# Patient Record
Sex: Male | Born: 1935 | Race: White | Hispanic: No | Smoking: Former smoker
Health system: Southern US, Community
[De-identification: ages and names within clinical notes are randomized; demographics above are authoritative.]

## PROBLEM LIST (undated history)

## (undated) DIAGNOSIS — I712 Thoracic aortic aneurysm, without rupture, unspecified: Secondary | ICD-10-CM

## (undated) DIAGNOSIS — I10 Essential (primary) hypertension: Secondary | ICD-10-CM

## (undated) DIAGNOSIS — E119 Type 2 diabetes mellitus without complications: Secondary | ICD-10-CM

## (undated) DIAGNOSIS — S065X9A Traumatic subdural hemorrhage with loss of consciousness of unspecified duration, initial encounter: Secondary | ICD-10-CM

## (undated) DIAGNOSIS — E7800 Pure hypercholesterolemia, unspecified: Secondary | ICD-10-CM

## (undated) HISTORY — PX: HERNIA REPAIR: SHX51

## (undated) HISTORY — PX: INGUINAL HERNIA REPAIR: SHX194

---

## 1997-06-18 ENCOUNTER — Emergency Department (HOSPITAL_COMMUNITY): Admission: EM | Admit: 1997-06-18 | Discharge: 1997-06-18 | Payer: Self-pay | Admitting: Emergency Medicine

## 1997-11-16 ENCOUNTER — Ambulatory Visit (HOSPITAL_COMMUNITY): Admission: RE | Admit: 1997-11-16 | Discharge: 1997-11-16 | Payer: Self-pay | Admitting: Cardiovascular Disease

## 1998-06-26 ENCOUNTER — Ambulatory Visit (HOSPITAL_COMMUNITY): Admission: RE | Admit: 1998-06-26 | Discharge: 1998-06-26 | Payer: Self-pay | Admitting: Cardiology

## 1998-07-20 ENCOUNTER — Encounter: Payer: Self-pay | Admitting: Cardiovascular Disease

## 1998-07-20 ENCOUNTER — Inpatient Hospital Stay (HOSPITAL_COMMUNITY): Admission: EM | Admit: 1998-07-20 | Discharge: 1998-07-26 | Payer: Self-pay | Admitting: Emergency Medicine

## 1999-08-06 ENCOUNTER — Encounter: Payer: Self-pay | Admitting: Urology

## 1999-08-06 ENCOUNTER — Encounter: Admission: RE | Admit: 1999-08-06 | Discharge: 1999-08-06 | Payer: Self-pay | Admitting: Urology

## 1999-08-21 ENCOUNTER — Encounter: Payer: Self-pay | Admitting: Urology

## 1999-08-21 ENCOUNTER — Encounter: Admission: RE | Admit: 1999-08-21 | Discharge: 1999-08-21 | Payer: Self-pay | Admitting: Urology

## 1999-08-29 ENCOUNTER — Encounter: Admission: RE | Admit: 1999-08-29 | Discharge: 1999-08-29 | Payer: Self-pay | Admitting: Urology

## 1999-08-29 ENCOUNTER — Encounter: Payer: Self-pay | Admitting: Urology

## 2000-05-08 ENCOUNTER — Other Ambulatory Visit: Admission: RE | Admit: 2000-05-08 | Discharge: 2000-05-08 | Payer: Self-pay | Admitting: Urology

## 2000-05-08 ENCOUNTER — Encounter (INDEPENDENT_AMBULATORY_CARE_PROVIDER_SITE_OTHER): Payer: Self-pay | Admitting: Specialist

## 2000-08-20 ENCOUNTER — Encounter: Payer: Self-pay | Admitting: Internal Medicine

## 2000-08-20 ENCOUNTER — Ambulatory Visit (HOSPITAL_COMMUNITY): Admission: RE | Admit: 2000-08-20 | Discharge: 2000-08-20 | Payer: Self-pay | Admitting: Internal Medicine

## 2000-08-26 ENCOUNTER — Emergency Department (HOSPITAL_COMMUNITY): Admission: EM | Admit: 2000-08-26 | Discharge: 2000-08-27 | Payer: Self-pay | Admitting: *Deleted

## 2001-11-01 ENCOUNTER — Encounter: Payer: Self-pay | Admitting: *Deleted

## 2001-11-01 ENCOUNTER — Emergency Department (HOSPITAL_COMMUNITY): Admission: EM | Admit: 2001-11-01 | Discharge: 2001-11-01 | Payer: Self-pay | Admitting: *Deleted

## 2002-09-21 ENCOUNTER — Emergency Department (HOSPITAL_COMMUNITY): Admission: EM | Admit: 2002-09-21 | Discharge: 2002-09-21 | Payer: Self-pay | Admitting: Emergency Medicine

## 2003-10-14 ENCOUNTER — Emergency Department (HOSPITAL_COMMUNITY): Admission: EM | Admit: 2003-10-14 | Discharge: 2003-10-14 | Payer: Self-pay | Admitting: *Deleted

## 2005-08-27 ENCOUNTER — Ambulatory Visit (HOSPITAL_COMMUNITY): Admission: RE | Admit: 2005-08-27 | Discharge: 2005-08-27 | Payer: Self-pay | Admitting: Family Medicine

## 2005-11-03 ENCOUNTER — Ambulatory Visit: Payer: Self-pay | Admitting: Pulmonary Disease

## 2005-11-08 ENCOUNTER — Emergency Department (HOSPITAL_COMMUNITY): Admission: EM | Admit: 2005-11-08 | Discharge: 2005-11-08 | Payer: Self-pay | Admitting: Emergency Medicine

## 2005-12-01 ENCOUNTER — Ambulatory Visit: Payer: Self-pay | Admitting: Pulmonary Disease

## 2005-12-03 ENCOUNTER — Ambulatory Visit: Payer: Self-pay | Admitting: Cardiology

## 2006-02-03 ENCOUNTER — Ambulatory Visit: Payer: Self-pay | Admitting: Pulmonary Disease

## 2006-02-11 ENCOUNTER — Ambulatory Visit (HOSPITAL_COMMUNITY): Admission: RE | Admit: 2006-02-11 | Discharge: 2006-02-11 | Payer: Self-pay | Admitting: Pulmonary Disease

## 2006-06-17 ENCOUNTER — Emergency Department (HOSPITAL_COMMUNITY): Admission: EM | Admit: 2006-06-17 | Discharge: 2006-06-17 | Payer: Self-pay | Admitting: Emergency Medicine

## 2007-01-06 ENCOUNTER — Emergency Department (HOSPITAL_COMMUNITY): Admission: EM | Admit: 2007-01-06 | Discharge: 2007-01-06 | Payer: Self-pay | Admitting: Emergency Medicine

## 2007-01-28 ENCOUNTER — Ambulatory Visit: Payer: Self-pay | Admitting: Internal Medicine

## 2007-01-28 LAB — CONVERTED CEMR LAB
Basophils Absolute: 0 10*3/uL (ref 0.0–0.1)
Eosinophils Relative: 1.4 % (ref 0.0–5.0)
Hemoglobin: 15.5 g/dL (ref 13.0–17.0)
IgE (Immunoglobulin E), Serum: 51.7 intl units/mL (ref 0.0–180.0)
Monocytes Absolute: 0.6 10*3/uL (ref 0.2–0.7)
Neutro Abs: 6.4 10*3/uL (ref 1.4–7.7)
RBC: 4.71 M/uL (ref 4.22–5.81)
RDW: 11.7 % (ref 11.5–14.6)

## 2007-03-06 ENCOUNTER — Emergency Department (HOSPITAL_COMMUNITY): Admission: EM | Admit: 2007-03-06 | Discharge: 2007-03-06 | Payer: Self-pay | Admitting: Emergency Medicine

## 2007-03-26 DIAGNOSIS — J309 Allergic rhinitis, unspecified: Secondary | ICD-10-CM | POA: Insufficient documentation

## 2007-03-26 DIAGNOSIS — Z862 Personal history of diseases of the blood and blood-forming organs and certain disorders involving the immune mechanism: Secondary | ICD-10-CM

## 2007-03-26 DIAGNOSIS — Z8639 Personal history of other endocrine, nutritional and metabolic disease: Secondary | ICD-10-CM

## 2007-03-26 DIAGNOSIS — K219 Gastro-esophageal reflux disease without esophagitis: Secondary | ICD-10-CM

## 2007-03-29 ENCOUNTER — Ambulatory Visit: Payer: Self-pay | Admitting: Internal Medicine

## 2007-03-29 DIAGNOSIS — R05 Cough: Secondary | ICD-10-CM

## 2007-03-29 DIAGNOSIS — J209 Acute bronchitis, unspecified: Secondary | ICD-10-CM

## 2007-05-09 ENCOUNTER — Emergency Department (HOSPITAL_COMMUNITY): Admission: EM | Admit: 2007-05-09 | Discharge: 2007-05-09 | Payer: Self-pay | Admitting: Emergency Medicine

## 2007-08-05 ENCOUNTER — Ambulatory Visit: Payer: Self-pay | Admitting: Internal Medicine

## 2007-09-07 ENCOUNTER — Encounter (INDEPENDENT_AMBULATORY_CARE_PROVIDER_SITE_OTHER): Payer: Self-pay | Admitting: General Surgery

## 2007-09-07 ENCOUNTER — Ambulatory Visit (HOSPITAL_COMMUNITY): Admission: RE | Admit: 2007-09-07 | Discharge: 2007-09-07 | Payer: Self-pay | Admitting: General Surgery

## 2007-11-24 ENCOUNTER — Ambulatory Visit: Payer: Self-pay | Admitting: Vascular Surgery

## 2007-11-24 ENCOUNTER — Ambulatory Visit (HOSPITAL_COMMUNITY): Admission: RE | Admit: 2007-11-24 | Discharge: 2007-11-24 | Payer: Self-pay | Admitting: Neurology

## 2007-11-24 ENCOUNTER — Encounter (INDEPENDENT_AMBULATORY_CARE_PROVIDER_SITE_OTHER): Payer: Self-pay | Admitting: Neurology

## 2007-11-29 ENCOUNTER — Encounter: Admission: RE | Admit: 2007-11-29 | Discharge: 2007-11-29 | Payer: Self-pay | Admitting: Neurology

## 2008-01-28 ENCOUNTER — Emergency Department (HOSPITAL_COMMUNITY): Admission: EM | Admit: 2008-01-28 | Discharge: 2008-01-28 | Payer: Self-pay | Admitting: Emergency Medicine

## 2008-02-04 ENCOUNTER — Ambulatory Visit: Payer: Self-pay | Admitting: Internal Medicine

## 2008-02-19 DIAGNOSIS — J328 Other chronic sinusitis: Secondary | ICD-10-CM | POA: Insufficient documentation

## 2008-09-24 ENCOUNTER — Emergency Department (HOSPITAL_COMMUNITY): Admission: EM | Admit: 2008-09-24 | Discharge: 2008-09-24 | Payer: Self-pay | Admitting: Emergency Medicine

## 2009-02-02 ENCOUNTER — Ambulatory Visit: Payer: Self-pay | Admitting: Internal Medicine

## 2009-02-02 DIAGNOSIS — H6120 Impacted cerumen, unspecified ear: Secondary | ICD-10-CM

## 2010-03-08 ENCOUNTER — Emergency Department (HOSPITAL_COMMUNITY)
Admission: EM | Admit: 2010-03-08 | Discharge: 2010-03-08 | Payer: Self-pay | Source: Home / Self Care | Admitting: Emergency Medicine

## 2010-03-11 ENCOUNTER — Emergency Department (HOSPITAL_COMMUNITY)
Admission: EM | Admit: 2010-03-11 | Discharge: 2010-03-11 | Payer: Self-pay | Source: Home / Self Care | Admitting: Emergency Medicine

## 2010-04-09 ENCOUNTER — Ambulatory Visit
Admission: RE | Admit: 2010-04-09 | Discharge: 2010-04-09 | Payer: Self-pay | Source: Home / Self Care | Attending: Surgery | Admitting: Surgery
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
## 2010-05-16 DIAGNOSIS — S065X9A Traumatic subdural hemorrhage with loss of consciousness of unspecified duration, initial encounter: Secondary | ICD-10-CM

## 2010-05-16 DIAGNOSIS — S065XAA Traumatic subdural hemorrhage with loss of consciousness status unknown, initial encounter: Secondary | ICD-10-CM

## 2010-05-16 HISTORY — DX: Traumatic subdural hemorrhage with loss of consciousness of unspecified duration, initial encounter: S06.5X9A

## 2010-05-16 HISTORY — DX: Traumatic subdural hemorrhage with loss of consciousness status unknown, initial encounter: S06.5XAA

## 2010-05-26 ENCOUNTER — Inpatient Hospital Stay (HOSPITAL_COMMUNITY): Payer: Medicare Other

## 2010-05-26 ENCOUNTER — Emergency Department (HOSPITAL_COMMUNITY): Payer: Medicare Other

## 2010-05-26 ENCOUNTER — Emergency Department (HOSPITAL_COMMUNITY)
Admission: EM | Admit: 2010-05-26 | Discharge: 2010-05-26 | Disposition: A | Discharge status: Other Acute Inpatient Hospital | Payer: Medicare Other | Source: Home / Self Care | Attending: Emergency Medicine | Admitting: Emergency Medicine

## 2010-05-26 ENCOUNTER — Inpatient Hospital Stay (HOSPITAL_COMMUNITY)
Admission: AD | Admit: 2010-05-26 | Discharge: 2010-06-13 | DRG: 085 | Disposition: A | Discharge status: Skilled Nursing Facility | Payer: Medicare Other | Source: Other Acute Inpatient Hospital | Attending: Pulmonary Disease | Admitting: Pulmonary Disease

## 2010-05-26 DIAGNOSIS — R1110 Vomiting, unspecified: Secondary | ICD-10-CM | POA: Diagnosis present

## 2010-05-26 DIAGNOSIS — I959 Hypotension, unspecified: Secondary | ICD-10-CM | POA: Diagnosis present

## 2010-05-26 DIAGNOSIS — W19XXXA Unspecified fall, initial encounter: Secondary | ICD-10-CM | POA: Diagnosis present

## 2010-05-26 DIAGNOSIS — J15211 Pneumonia due to Methicillin susceptible Staphylococcus aureus: Secondary | ICD-10-CM | POA: Diagnosis not present

## 2010-05-26 DIAGNOSIS — R4182 Altered mental status, unspecified: Secondary | ICD-10-CM | POA: Insufficient documentation

## 2010-05-26 DIAGNOSIS — J9600 Acute respiratory failure, unspecified whether with hypoxia or hypercapnia: Secondary | ICD-10-CM | POA: Diagnosis present

## 2010-05-26 DIAGNOSIS — E871 Hypo-osmolality and hyponatremia: Secondary | ICD-10-CM | POA: Diagnosis not present

## 2010-05-26 DIAGNOSIS — S065XAA Traumatic subdural hemorrhage with loss of consciousness status unknown, initial encounter: Principal | ICD-10-CM | POA: Diagnosis present

## 2010-05-26 DIAGNOSIS — R6521 Severe sepsis with septic shock: Secondary | ICD-10-CM

## 2010-05-26 DIAGNOSIS — N400 Enlarged prostate without lower urinary tract symptoms: Secondary | ICD-10-CM | POA: Diagnosis present

## 2010-05-26 DIAGNOSIS — A419 Sepsis, unspecified organism: Secondary | ICD-10-CM

## 2010-05-26 DIAGNOSIS — G9340 Encephalopathy, unspecified: Secondary | ICD-10-CM | POA: Diagnosis not present

## 2010-05-26 DIAGNOSIS — R6520 Severe sepsis without septic shock: Secondary | ICD-10-CM

## 2010-05-26 DIAGNOSIS — Z794 Long term (current) use of insulin: Secondary | ICD-10-CM

## 2010-05-26 DIAGNOSIS — W1809XA Striking against other object with subsequent fall, initial encounter: Secondary | ICD-10-CM | POA: Insufficient documentation

## 2010-05-26 DIAGNOSIS — S060X9A Concussion with loss of consciousness of unspecified duration, initial encounter: Secondary | ICD-10-CM | POA: Insufficient documentation

## 2010-05-26 DIAGNOSIS — Y9229 Other specified public building as the place of occurrence of the external cause: Secondary | ICD-10-CM | POA: Insufficient documentation

## 2010-05-26 DIAGNOSIS — E876 Hypokalemia: Secondary | ICD-10-CM | POA: Diagnosis not present

## 2010-05-26 DIAGNOSIS — E1169 Type 2 diabetes mellitus with other specified complication: Secondary | ICD-10-CM | POA: Insufficient documentation

## 2010-05-26 DIAGNOSIS — R404 Transient alteration of awareness: Secondary | ICD-10-CM | POA: Diagnosis present

## 2010-05-26 DIAGNOSIS — E870 Hyperosmolality and hypernatremia: Secondary | ICD-10-CM | POA: Diagnosis not present

## 2010-05-26 DIAGNOSIS — S06309A Unspecified focal traumatic brain injury with loss of consciousness of unspecified duration, initial encounter: Principal | ICD-10-CM | POA: Diagnosis present

## 2010-05-26 DIAGNOSIS — Z6835 Body mass index (BMI) 35.0-35.9, adult: Secondary | ICD-10-CM

## 2010-05-26 DIAGNOSIS — Z515 Encounter for palliative care: Secondary | ICD-10-CM

## 2010-05-26 DIAGNOSIS — R579 Shock, unspecified: Secondary | ICD-10-CM | POA: Diagnosis not present

## 2010-05-26 DIAGNOSIS — I10 Essential (primary) hypertension: Secondary | ICD-10-CM | POA: Diagnosis present

## 2010-05-26 DIAGNOSIS — J309 Allergic rhinitis, unspecified: Secondary | ICD-10-CM | POA: Diagnosis present

## 2010-05-26 DIAGNOSIS — IMO0002 Reserved for concepts with insufficient information to code with codable children: Secondary | ICD-10-CM | POA: Diagnosis present

## 2010-05-26 DIAGNOSIS — J690 Pneumonitis due to inhalation of food and vomit: Secondary | ICD-10-CM | POA: Diagnosis not present

## 2010-05-26 DIAGNOSIS — Z7982 Long term (current) use of aspirin: Secondary | ICD-10-CM

## 2010-05-26 LAB — CBC
Hemoglobin: 16 g/dL (ref 13.0–17.0)
MCHC: 33.4 g/dL (ref 30.0–36.0)
MCV: 91.1 fL (ref 78.0–100.0)
Platelets: 308 10*3/uL (ref 150–400)
RDW: 13 % (ref 11.5–15.5)
WBC: 12.4 10*3/uL — ABNORMAL HIGH (ref 4.0–10.5)

## 2010-05-26 LAB — ETHANOL: Alcohol, Ethyl (B): <5 mg/dL (ref 0–10)

## 2010-05-26 LAB — DIFFERENTIAL
Basophils Absolute: 0 10*3/uL (ref 0.0–0.1)
Basophils Relative: 0 % (ref 0–1)
Eosinophils Absolute: 0.4 10*3/uL (ref 0.0–0.7)
Lymphocytes Relative: 49 % — ABNORMAL HIGH (ref 12–46)
Monocytes Absolute: 1 10*3/uL (ref 0.1–1.0)

## 2010-05-26 LAB — BLOOD GAS, ARTERIAL
Acid-base deficit: 3.4 mmol/L — ABNORMAL HIGH (ref 0.0–2.0)
Acid-base deficit: 2.4 mmol/L — ABNORMAL HIGH (ref 0.0–2.0)
Bicarbonate: 21.1 mEq/L (ref 20.0–24.0)
FIO2: 100 %
MECHVT: 550 mL
MECHVT: 450 mL
O2 Saturation: 98.6 %
RATE: 16 resp/min
TCO2: 23.4 mmol/L (ref 0–100)
pH, Arterial: 7.366 (ref 7.350–7.450)
pH, Arterial: 7.354 (ref 7.350–7.450)
pO2, Arterial: 126 mmHg — ABNORMAL HIGH (ref 80.0–100.0)

## 2010-05-26 LAB — URINALYSIS, ROUTINE W REFLEX MICROSCOPIC
Bilirubin Urine: NEGATIVE
Glucose, UA: 500 mg/dL — AB
Hgb urine dipstick: NEGATIVE
Specific Gravity, Urine: >1.03 — ABNORMAL HIGH (ref 1.005–1.030)
pH: 5 (ref 5.0–8.0)

## 2010-05-26 LAB — GLUCOSE, CAPILLARY
Glucose-Capillary: 137 mg/dL — ABNORMAL HIGH (ref 70–99)
Glucose-Capillary: 106 mg/dL — ABNORMAL HIGH (ref 70–99)

## 2010-05-26 LAB — BASIC METABOLIC PANEL
BUN: 12 mg/dL (ref 6–23)
CO2: 21 mEq/L (ref 19–32)
GFR calc Af Amer: >60 mL/min (ref >60)
GFR calc non Af Amer: >60 mL/min (ref >60)
Glucose, Bld: 127 mg/dL — ABNORMAL HIGH (ref 70–99)
Potassium: 3.8 mEq/L (ref 3.5–5.1)

## 2010-05-26 LAB — LACTIC ACID, PLASMA: Lactic Acid, Venous: 2.5 mmol/L — ABNORMAL HIGH (ref 0.5–2.2)

## 2010-05-26 LAB — RAPID URINE DRUG SCREEN, HOSP PERFORMED
Barbiturates: NOT DETECTED
Cocaine: NOT DETECTED
Tetrahydrocannabinol: NOT DETECTED

## 2010-05-26 LAB — MRSA PCR SCREENING: MRSA by PCR: NEGATIVE

## 2010-05-26 MED ORDER — IOHEXOL 350 MG/ML SOLN
120.0000 mL | Freq: Once | INTRAVENOUS | Status: AC | PRN
  Administered 2010-05-26: 120 mL via INTRAVENOUS

## 2010-05-27 ENCOUNTER — Inpatient Hospital Stay (HOSPITAL_COMMUNITY): Payer: Medicare Other

## 2010-05-27 DIAGNOSIS — R55 Syncope and collapse: Secondary | ICD-10-CM

## 2010-05-27 DIAGNOSIS — I609 Nontraumatic subarachnoid hemorrhage, unspecified: Secondary | ICD-10-CM

## 2010-05-27 DIAGNOSIS — E162 Hypoglycemia, unspecified: Secondary | ICD-10-CM

## 2010-05-27 DIAGNOSIS — J9600 Acute respiratory failure, unspecified whether with hypoxia or hypercapnia: Secondary | ICD-10-CM

## 2010-05-27 LAB — BASIC METABOLIC PANEL
Calcium: 7.7 mg/dL — ABNORMAL LOW (ref 8.4–10.5)
Calcium: 9 mg/dL (ref 8.4–10.5)
Creatinine, Ser: 0.92 mg/dL (ref 0.4–1.5)
GFR calc Af Amer: >60 mL/min (ref >60)
GFR calc Af Amer: >60 mL/min (ref >60)
GFR calc non Af Amer: >60 mL/min (ref >60)
GFR calc non Af Amer: >60 mL/min (ref >60)
Sodium: 135 mEq/L (ref 135–145)
Sodium: 136 mEq/L (ref 135–145)

## 2010-05-27 LAB — DIFFERENTIAL
Basophils Absolute: 0 10*3/uL (ref 0.0–0.1)
Eosinophils Absolute: 0.1 10*3/uL (ref 0.0–0.7)
Eosinophils Relative: 0 % (ref 0–5)
Lymphocytes Relative: 12 % (ref 12–46)
Lymphocytes Relative: 16 % (ref 12–46)
Lymphs Abs: 1.2 10*3/uL (ref 0.7–4.0)
Monocytes Relative: 8 % (ref 3–12)
Neutro Abs: 8.3 10*3/uL — ABNORMAL HIGH (ref 1.7–7.7)
Neutro Abs: 8.6 10*3/uL — ABNORMAL HIGH (ref 1.7–7.7)
Neutrophils Relative %: 71 % (ref 43–77)
Neutrophils Relative %: 79 % — ABNORMAL HIGH (ref 43–77)

## 2010-05-27 LAB — BLOOD GAS, ARTERIAL
Acid-base deficit: 0.7 mmol/L (ref 0.0–2.0)
Bicarbonate: 23.8 mEq/L (ref 20.0–24.0)
MECHVT: 450 mL
RATE: 15 resp/min
TCO2: 25.1 mmol/L (ref 0–100)
pCO2 arterial: 41.7 mmHg (ref 35.0–45.0)
pO2, Arterial: 59.3 mmHg — ABNORMAL LOW (ref 80.0–100.0)

## 2010-05-27 LAB — TYPE AND SCREEN

## 2010-05-27 LAB — POCT CARDIAC MARKERS: CKMB, poc: 5.2 ng/mL (ref 1.0–8.0)

## 2010-05-27 LAB — GLUCOSE, CAPILLARY
Glucose-Capillary: 103 mg/dL — ABNORMAL HIGH (ref 70–99)
Glucose-Capillary: 180 mg/dL — ABNORMAL HIGH (ref 70–99)
Glucose-Capillary: 185 mg/dL — ABNORMAL HIGH (ref 70–99)
Glucose-Capillary: 185 mg/dL — ABNORMAL HIGH (ref 70–99)
Glucose-Capillary: 207 mg/dL — ABNORMAL HIGH (ref 70–99)
Glucose-Capillary: 211 mg/dL — ABNORMAL HIGH (ref 70–99)

## 2010-05-27 LAB — CBC
HCT: 41.2 % (ref 39.0–52.0)
Hemoglobin: 14.8 g/dL (ref 13.0–17.0)
MCHC: 33.5 g/dL (ref 30.0–36.0)
Platelets: 286 10*3/uL (ref 150–400)
RBC: 4.63 MIL/uL (ref 4.22–5.81)
RBC: 4.78 MIL/uL (ref 4.22–5.81)
RDW: 13.3 % (ref 11.5–15.5)
RDW: 13.3 % (ref 11.5–15.5)
WBC: 10.5 10*3/uL (ref 4.0–10.5)
WBC: 12.1 10*3/uL — ABNORMAL HIGH (ref 4.0–10.5)

## 2010-05-27 LAB — COMPREHENSIVE METABOLIC PANEL
BUN: 11 mg/dL (ref 6–23)
CO2: 26 mEq/L (ref 19–32)
Chloride: 107 mEq/L (ref 96–112)
Creatinine, Ser: 0.97 mg/dL (ref 0.4–1.5)
GFR calc non Af Amer: >60 mL/min (ref >60)
Total Bilirubin: 0.7 mg/dL (ref 0.3–1.2)

## 2010-05-27 LAB — CARBOXYHEMOGLOBIN: Carboxyhemoglobin: 0.9 % (ref 0.5–1.5)

## 2010-05-27 LAB — CARDIAC PANEL(CRET KIN+CKTOT+MB+TROPI)
Relative Index: 0.8 (ref 0.0–2.5)
Troponin I: 0.04 ng/mL (ref 0.00–0.06)

## 2010-05-27 LAB — APTT: aPTT: 26 seconds (ref 24–37)

## 2010-05-27 LAB — PROTIME-INR
INR: 1.01 (ref 0.00–1.49)
INR: 0.89 (ref 0.00–1.49)
Prothrombin Time: 12.3 seconds (ref 11.6–15.2)

## 2010-05-27 LAB — HEMOGLOBIN A1C
Hgb A1c MFr Bld: 7.6 % — ABNORMAL HIGH (ref <5.7)
Mean Plasma Glucose: 171 mg/dL — ABNORMAL HIGH (ref <117)

## 2010-05-28 ENCOUNTER — Inpatient Hospital Stay (HOSPITAL_COMMUNITY): Payer: Medicare Other

## 2010-05-28 LAB — URINE CULTURE: Colony Count: NO GROWTH

## 2010-05-28 LAB — BASIC METABOLIC PANEL
BUN: 11 mg/dL (ref 6–23)
BUN: 11 mg/dL (ref 6–23)
Chloride: 108 mEq/L (ref 96–112)
Creatinine, Ser: 1.03 mg/dL (ref 0.4–1.5)
GFR calc non Af Amer: >60 mL/min (ref >60)
Glucose, Bld: 212 mg/dL — ABNORMAL HIGH (ref 70–99)
Potassium: 4.3 mEq/L (ref 3.5–5.1)

## 2010-05-28 LAB — CBC
HCT: 37.4 % — ABNORMAL LOW (ref 39.0–52.0)
MCH: 31.1 pg (ref 26.0–34.0)
MCV: 91.4 fL (ref 78.0–100.0)
RBC: 4.09 MIL/uL — ABNORMAL LOW (ref 4.22–5.81)
WBC: 12.9 10*3/uL — ABNORMAL HIGH (ref 4.0–10.5)

## 2010-05-28 LAB — GLUCOSE, CAPILLARY
Glucose-Capillary: 197 mg/dL — ABNORMAL HIGH (ref 70–99)
Glucose-Capillary: 187 mg/dL — ABNORMAL HIGH (ref 70–99)

## 2010-05-28 LAB — MAGNESIUM: Magnesium: 1.6 mg/dL (ref 1.5–2.5)

## 2010-05-29 ENCOUNTER — Inpatient Hospital Stay (HOSPITAL_COMMUNITY): Payer: Medicare Other

## 2010-05-29 DIAGNOSIS — E162 Hypoglycemia, unspecified: Secondary | ICD-10-CM

## 2010-05-29 DIAGNOSIS — J9600 Acute respiratory failure, unspecified whether with hypoxia or hypercapnia: Secondary | ICD-10-CM

## 2010-05-29 DIAGNOSIS — I609 Nontraumatic subarachnoid hemorrhage, unspecified: Secondary | ICD-10-CM

## 2010-05-29 DIAGNOSIS — R404 Transient alteration of awareness: Secondary | ICD-10-CM

## 2010-05-29 LAB — BLOOD GAS, ARTERIAL
Acid-base deficit: 6.5 mmol/L — ABNORMAL HIGH (ref 0.0–2.0)
Bicarbonate: 17 mEq/L — ABNORMAL LOW (ref 20.0–24.0)
Drawn by: 320991
FIO2: 0.6 %
MECHVT: 570 mL
O2 Saturation: 98.7 %
PEEP: 5 cmH2O
Patient temperature: 98.6
RATE: 16 resp/min
TCO2: 17.9 mmol/L (ref 0–100)
pCO2 arterial: 26.5 mmHg — ABNORMAL LOW (ref 35.0–45.0)
pH, Arterial: 7.425 (ref 7.350–7.450)
pO2, Arterial: 118 mmHg — ABNORMAL HIGH (ref 80.0–100.0)

## 2010-05-29 LAB — PHOSPHORUS: Phosphorus: 2.3 mg/dL (ref 2.3–4.6)

## 2010-05-29 LAB — GLUCOSE, CAPILLARY
Glucose-Capillary: 238 mg/dL — ABNORMAL HIGH (ref 70–99)
Glucose-Capillary: 172 mg/dL — ABNORMAL HIGH (ref 70–99)
Glucose-Capillary: 252 mg/dL — ABNORMAL HIGH (ref 70–99)
Glucose-Capillary: 161 mg/dL — ABNORMAL HIGH (ref 70–99)

## 2010-05-29 LAB — CULTURE, RESPIRATORY W GRAM STAIN

## 2010-05-29 LAB — CBC
HCT: 35.4 % — ABNORMAL LOW (ref 39.0–52.0)
Hemoglobin: 12 g/dL — ABNORMAL LOW (ref 13.0–17.0)
RDW: 12.7 % (ref 11.5–15.5)
WBC: 11.7 10*3/uL — ABNORMAL HIGH (ref 4.0–10.5)

## 2010-05-29 LAB — BASIC METABOLIC PANEL
CO2: 23 mEq/L (ref 19–32)
Calcium: 7.8 mg/dL — ABNORMAL LOW (ref 8.4–10.5)
Chloride: 106 mEq/L (ref 96–112)
GFR calc Af Amer: >60 mL/min (ref >60)
Sodium: 135 mEq/L (ref 135–145)

## 2010-05-29 LAB — MAGNESIUM: Magnesium: 2.2 mg/dL (ref 1.5–2.5)

## 2010-05-30 ENCOUNTER — Inpatient Hospital Stay (HOSPITAL_COMMUNITY): Payer: Medicare Other

## 2010-05-30 LAB — GLUCOSE, CAPILLARY
Glucose-Capillary: 164 mg/dL — ABNORMAL HIGH (ref 70–99)
Glucose-Capillary: 155 mg/dL — ABNORMAL HIGH (ref 70–99)
Glucose-Capillary: 183 mg/dL — ABNORMAL HIGH (ref 70–99)

## 2010-05-30 LAB — CULTURE, BAL-QUANTITATIVE W GRAM STAIN

## 2010-05-30 LAB — BASIC METABOLIC PANEL
CO2: 26 mEq/L (ref 19–32)
Calcium: 7.6 mg/dL — ABNORMAL LOW (ref 8.4–10.5)
GFR calc Af Amer: >60 mL/min (ref >60)
Glucose, Bld: 161 mg/dL — ABNORMAL HIGH (ref 70–99)
Potassium: 4.2 mEq/L (ref 3.5–5.1)
Sodium: 137 mEq/L (ref 135–145)

## 2010-05-30 LAB — BLOOD GAS, ARTERIAL
Bicarbonate: 20.9 mEq/L (ref 20.0–24.0)
O2 Saturation: 94.9 %
Patient temperature: 101.3
Pressure control: 10 cmH2O
RATE: 14 resp/min
TCO2: 21.9 mmol/L (ref 0–100)
pH, Arterial: 7.366 (ref 7.350–7.450)

## 2010-05-30 LAB — CBC
HCT: 37.2 % — ABNORMAL LOW (ref 39.0–52.0)
Hemoglobin: 12.9 g/dL — ABNORMAL LOW (ref 13.0–17.0)
MCHC: 34.7 g/dL (ref 30.0–36.0)
RBC: 4.21 MIL/uL — ABNORMAL LOW (ref 4.22–5.81)
WBC: 15.4 10*3/uL — ABNORMAL HIGH (ref 4.0–10.5)

## 2010-05-31 ENCOUNTER — Inpatient Hospital Stay (HOSPITAL_COMMUNITY): Payer: Medicare Other

## 2010-05-31 LAB — BASIC METABOLIC PANEL
CO2: 23 mEq/L (ref 19–32)
Chloride: 116 mEq/L — ABNORMAL HIGH (ref 96–112)
GFR calc Af Amer: >60 mL/min (ref >60)
Potassium: 3.6 mEq/L (ref 3.5–5.1)
Sodium: 143 mEq/L (ref 135–145)

## 2010-05-31 LAB — GLUCOSE, CAPILLARY
Glucose-Capillary: 242 mg/dL — ABNORMAL HIGH (ref 70–99)
Glucose-Capillary: 272 mg/dL — ABNORMAL HIGH (ref 70–99)
Glucose-Capillary: 203 mg/dL — ABNORMAL HIGH (ref 70–99)
Glucose-Capillary: 235 mg/dL — ABNORMAL HIGH (ref 70–99)

## 2010-05-31 LAB — DIFFERENTIAL
Basophils Absolute: 0 10*3/uL (ref 0.0–0.1)
Basophils Relative: 0 % (ref 0–1)
Monocytes Relative: 8 % (ref 3–12)
Neutro Abs: 10.8 10*3/uL — ABNORMAL HIGH (ref 1.7–7.7)
Neutrophils Relative %: 85 % — ABNORMAL HIGH (ref 43–77)

## 2010-05-31 LAB — CBC
Hemoglobin: 12.2 g/dL — ABNORMAL LOW (ref 13.0–17.0)
RBC: 4.03 MIL/uL — ABNORMAL LOW (ref 4.22–5.81)

## 2010-06-01 ENCOUNTER — Inpatient Hospital Stay (HOSPITAL_COMMUNITY): Payer: Medicare Other

## 2010-06-01 LAB — BASIC METABOLIC PANEL
Calcium: 7.5 mg/dL — ABNORMAL LOW (ref 8.4–10.5)
Creatinine, Ser: 0.99 mg/dL (ref 0.4–1.5)
GFR calc non Af Amer: >60 mL/min (ref >60)
Glucose, Bld: 186 mg/dL — ABNORMAL HIGH (ref 70–99)
Sodium: 140 mEq/L (ref 135–145)

## 2010-06-01 LAB — CBC
MCV: 89.8 fL (ref 78.0–100.0)
Platelets: 239 10*3/uL (ref 150–400)
RDW: 13.6 % (ref 11.5–15.5)
WBC: 15.7 10*3/uL — ABNORMAL HIGH (ref 4.0–10.5)

## 2010-06-01 LAB — GLUCOSE, CAPILLARY: Glucose-Capillary: 158 mg/dL — ABNORMAL HIGH (ref 70–99)

## 2010-06-01 LAB — CULTURE, RESPIRATORY W GRAM STAIN

## 2010-06-01 LAB — CULTURE, BLOOD (SINGLE)
Culture  Setup Time: 201203112356
Culture: NO GROWTH

## 2010-06-02 ENCOUNTER — Inpatient Hospital Stay (HOSPITAL_COMMUNITY): Payer: Medicare Other

## 2010-06-02 LAB — CULTURE, BLOOD (SINGLE): Culture: NO GROWTH

## 2010-06-02 LAB — CBC
HCT: 34.8 % — ABNORMAL LOW (ref 39.0–52.0)
Hemoglobin: 11.8 g/dL — ABNORMAL LOW (ref 13.0–17.0)
MCH: 30.4 pg (ref 26.0–34.0)
MCV: 89.7 fL (ref 78.0–100.0)
RBC: 3.88 MIL/uL — ABNORMAL LOW (ref 4.22–5.81)
WBC: 12.4 10*3/uL — ABNORMAL HIGH (ref 4.0–10.5)

## 2010-06-02 LAB — BLOOD GAS, ARTERIAL
Drawn by: 312881
PEEP: 5 cmH2O
Patient temperature: 99
Pressure control: 10 cmH2O
RATE: 14 resp/min
TCO2: 27.1 mmol/L (ref 0–100)
pCO2 arterial: 41.1 mmHg (ref 35.0–45.0)
pH, Arterial: 7.417 (ref 7.350–7.450)

## 2010-06-02 LAB — BASIC METABOLIC PANEL
BUN: 30 mg/dL — ABNORMAL HIGH (ref 6–23)
Calcium: 7.8 mg/dL — ABNORMAL LOW (ref 8.4–10.5)
Chloride: 109 mEq/L (ref 96–112)
Creatinine, Ser: 0.77 mg/dL (ref 0.4–1.5)

## 2010-06-02 LAB — MAGNESIUM: Magnesium: 2.8 mg/dL — ABNORMAL HIGH (ref 1.5–2.5)

## 2010-06-02 LAB — GLUCOSE, CAPILLARY
Glucose-Capillary: 209 mg/dL — ABNORMAL HIGH (ref 70–99)
Glucose-Capillary: 208 mg/dL — ABNORMAL HIGH (ref 70–99)

## 2010-06-02 NOTE — Consult Note (Signed)
  NAMEGARETH, Garrett [name] NO.:  [id number]  MEDICAL RECORD NO.:  [id number]           PATIENT TYPE:  I  LOCATION:  3113                         FACILITY:  [facility]  PHYSICIAN:  Donalee Citrin, M.D.        DATE OF BIRTH:  1933-05-28  DATE OF CONSULTATION:  05/26/2010 DATE OF DISCHARGE:                                CONSULTATION   ADMITTING DIAGNOSES:  Syncopal episode, fall, closed-head injury, traumatic subarachnoid hemorrhage.  HISTORY OF PRESENT ILLNESS:  The patient is a 77 year old gentleman who apparently had a hypoglycemic episode where he had a syncopal episode and there was some question raised that he may have had a seizure but when EMS arrived they were not able to register a CBG.  The patient apparently was very combative when we started to come round.  They had to intubate him to control him and was taken to [facility], was evaluated.  CT scan obtained, it showed some traumatic subarachnoid, some questionable bifrontal contusions.  The patient has been transferred down for critical care management at [facility].  PAST MEDICAL HISTORY:  Remarkable for diabetes and hypertension.  SURGICAL HISTORY:  Unavailable and there is no family around to obtain the rest of his social and medication history.  ALLERGIES:  He has no known medication allergies.  MEDICATIONS:  We do not know what medications he is on.  At [facility], his blood work was remarkable for an ABG was 7.36, pCO2 of 377, and pO2 of 288.  Exam, the patient is intubated right now.  His pupils are equal and reactive.  He has roving eye movements.  He has bilateral purposeful movements.  He does not follow commands bilaterally.  His CT scan of his head shows some small bifrontal contusions with a traumatic subarachnoid and small parafalcine subdural.  CT scan of his C-spine is unremarkable except for degenerative changes.  Remainder of his blood work at [facility] is remarkable for sodium of 144,  potassium 3.8, chloride 107, carbon dioxide 21, glucose 127, BUN and creatinine 12 and 1.8 respectively.  White count is 12.8.  Hemoglobin/hematocrit were 16 and 47.  Platelet count was 308.  ASSESSMENT AND PLAN:  This is a 77 year old gentleman with multiple medical problems who has syncopal episode, striking his head, does not appear to have been on any kind of blood thinners or aspirin, has some small bifrontal contusions, little bit of traumatic subarachnoid.  The patient has been transferred and intubated, so the patient will be admitted to the Critical Care Service, they will extubate him as they feel as indicated.  I will repeat a CT scan in the morning.  I do not think these contusions at this point need any additional intervention; however, we will follow as they may evolve and they may got that.          ______________________________ Donalee Citrin, M.D.     GC/MEDQ  D:  05/26/2010  T:  05/27/2010  Job:  [id number]  Electronically Signed by Donalee Citrin M.D. on 06/02/2010 04:00:19 PM

## 2010-06-03 ENCOUNTER — Telehealth: Payer: Self-pay | Admitting: Internal Medicine

## 2010-06-03 ENCOUNTER — Inpatient Hospital Stay (HOSPITAL_COMMUNITY): Payer: Medicare Other

## 2010-06-03 DIAGNOSIS — J15211 Pneumonia due to Methicillin susceptible Staphylococcus aureus: Secondary | ICD-10-CM

## 2010-06-03 DIAGNOSIS — F05 Delirium due to known physiological condition: Secondary | ICD-10-CM

## 2010-06-03 LAB — GLUCOSE, CAPILLARY
Glucose-Capillary: 153 mg/dL — ABNORMAL HIGH (ref 70–99)
Glucose-Capillary: 155 mg/dL — ABNORMAL HIGH (ref 70–99)
Glucose-Capillary: 168 mg/dL — ABNORMAL HIGH (ref 70–99)
Glucose-Capillary: 170 mg/dL — ABNORMAL HIGH (ref 70–99)
Glucose-Capillary: 193 mg/dL — ABNORMAL HIGH (ref 70–99)

## 2010-06-03 LAB — CBC
MCH: 30.3 pg (ref 26.0–34.0)
MCHC: 33.8 g/dL (ref 30.0–36.0)
Platelets: 337 10*3/uL (ref 150–400)
RBC: 4.02 MIL/uL — ABNORMAL LOW (ref 4.22–5.81)

## 2010-06-03 LAB — MAGNESIUM: Magnesium: 2.5 mg/dL (ref 1.5–2.5)

## 2010-06-03 LAB — BLOOD GAS, ARTERIAL
Bicarbonate: 30.2 mEq/L — ABNORMAL HIGH (ref 20.0–24.0)
TCO2: 31.6 mmol/L (ref 0–100)
pCO2 arterial: 42.6 mmHg (ref 35.0–45.0)
pH, Arterial: 7.462 — ABNORMAL HIGH (ref 7.350–7.450)

## 2010-06-03 LAB — PHOSPHORUS: Phosphorus: 2.5 mg/dL (ref 2.3–4.6)

## 2010-06-03 LAB — BASIC METABOLIC PANEL
Calcium: 7.5 mg/dL — ABNORMAL LOW (ref 8.4–10.5)
Chloride: 102 mEq/L (ref 96–112)
Creatinine, Ser: 0.83 mg/dL (ref 0.4–1.5)
GFR calc Af Amer: >60 mL/min (ref >60)
Sodium: 139 mEq/L (ref 135–145)

## 2010-06-04 ENCOUNTER — Inpatient Hospital Stay (HOSPITAL_COMMUNITY): Payer: Medicare Other

## 2010-06-04 LAB — GLUCOSE, CAPILLARY
Glucose-Capillary: 150 mg/dL — ABNORMAL HIGH (ref 70–99)
Glucose-Capillary: 153 mg/dL — ABNORMAL HIGH (ref 70–99)
Glucose-Capillary: 163 mg/dL — ABNORMAL HIGH (ref 70–99)

## 2010-06-04 LAB — PROCALCITONIN: Procalcitonin: 0.29 ng/mL

## 2010-06-05 ENCOUNTER — Inpatient Hospital Stay (HOSPITAL_COMMUNITY): Payer: Medicare Other

## 2010-06-05 LAB — BLOOD GAS, ARTERIAL
Bicarbonate: 24.9 mEq/L — ABNORMAL HIGH (ref 20.0–24.0)
Drawn by: 129711
O2 Content: 3 L/min
O2 Saturation: 94 %
pCO2 arterial: 36.3 mmHg (ref 35.0–45.0)
pH, Arterial: 7.452 — ABNORMAL HIGH (ref 7.350–7.450)
pO2, Arterial: 68 mmHg — ABNORMAL LOW (ref 80.0–100.0)

## 2010-06-05 LAB — DIFFERENTIAL
Basophils Absolute: 0 10*3/uL (ref 0.0–0.1)
Eosinophils Relative: 0 % (ref 0–5)
Lymphocytes Relative: 6 % — ABNORMAL LOW (ref 12–46)
Lymphs Abs: 0.7 10*3/uL (ref 0.7–4.0)
Neutro Abs: 9.7 10*3/uL — ABNORMAL HIGH (ref 1.7–7.7)

## 2010-06-05 LAB — BASIC METABOLIC PANEL
BUN: 33 mg/dL — ABNORMAL HIGH (ref 6–23)
CO2: 26 mEq/L (ref 19–32)
Chloride: 110 mEq/L (ref 96–112)
GFR calc non Af Amer: >60 mL/min (ref >60)
Glucose, Bld: 186 mg/dL — ABNORMAL HIGH (ref 70–99)
Potassium: 3.3 mEq/L — ABNORMAL LOW (ref 3.5–5.1)
Sodium: 143 mEq/L (ref 135–145)

## 2010-06-05 LAB — CBC
HCT: 33.4 % — ABNORMAL LOW (ref 39.0–52.0)
Hemoglobin: 11.2 g/dL — ABNORMAL LOW (ref 13.0–17.0)
MCV: 90.5 fL (ref 78.0–100.0)
RDW: 13.6 % (ref 11.5–15.5)
WBC: 11 10*3/uL — ABNORMAL HIGH (ref 4.0–10.5)

## 2010-06-05 LAB — GLUCOSE, CAPILLARY
Glucose-Capillary: 134 mg/dL — ABNORMAL HIGH (ref 70–99)
Glucose-Capillary: 158 mg/dL — ABNORMAL HIGH (ref 70–99)
Glucose-Capillary: 172 mg/dL — ABNORMAL HIGH (ref 70–99)
Glucose-Capillary: 216 mg/dL — ABNORMAL HIGH (ref 70–99)

## 2010-06-06 ENCOUNTER — Inpatient Hospital Stay (HOSPITAL_COMMUNITY): Payer: Medicare Other

## 2010-06-06 LAB — GLUCOSE, CAPILLARY
Glucose-Capillary: 167 mg/dL — ABNORMAL HIGH (ref 70–99)
Glucose-Capillary: 181 mg/dL — ABNORMAL HIGH (ref 70–99)
Glucose-Capillary: 299 mg/dL — ABNORMAL HIGH (ref 70–99)
Glucose-Capillary: 216 mg/dL — ABNORMAL HIGH (ref 70–99)

## 2010-06-07 ENCOUNTER — Inpatient Hospital Stay (HOSPITAL_COMMUNITY): Payer: Medicare Other

## 2010-06-07 LAB — BASIC METABOLIC PANEL
Calcium: 8.4 mg/dL (ref 8.4–10.5)
GFR calc Af Amer: >60 mL/min (ref >60)
GFR calc non Af Amer: >60 mL/min (ref >60)
Glucose, Bld: 200 mg/dL — ABNORMAL HIGH (ref 70–99)
Potassium: 3.5 mEq/L (ref 3.5–5.1)
Sodium: 146 mEq/L — ABNORMAL HIGH (ref 135–145)

## 2010-06-07 LAB — PHOSPHORUS: Phosphorus: 2.6 mg/dL (ref 2.3–4.6)

## 2010-06-07 LAB — GLUCOSE, CAPILLARY
Glucose-Capillary: 177 mg/dL — ABNORMAL HIGH (ref 70–99)
Glucose-Capillary: 228 mg/dL — ABNORMAL HIGH (ref 70–99)

## 2010-06-07 LAB — PROCALCITONIN: Procalcitonin: 0.12 ng/mL

## 2010-06-08 LAB — GLUCOSE, CAPILLARY
Glucose-Capillary: 265 mg/dL — ABNORMAL HIGH (ref 70–99)
Glucose-Capillary: 169 mg/dL — ABNORMAL HIGH (ref 70–99)
Glucose-Capillary: 195 mg/dL — ABNORMAL HIGH (ref 70–99)
Glucose-Capillary: 200 mg/dL — ABNORMAL HIGH (ref 70–99)

## 2010-06-09 DIAGNOSIS — I609 Nontraumatic subarachnoid hemorrhage, unspecified: Secondary | ICD-10-CM

## 2010-06-09 DIAGNOSIS — G9340 Encephalopathy, unspecified: Secondary | ICD-10-CM

## 2010-06-09 DIAGNOSIS — J189 Pneumonia, unspecified organism: Secondary | ICD-10-CM

## 2010-06-09 DIAGNOSIS — F05 Delirium due to known physiological condition: Secondary | ICD-10-CM

## 2010-06-09 LAB — GLUCOSE, CAPILLARY
Glucose-Capillary: 221 mg/dL — ABNORMAL HIGH (ref 70–99)
Glucose-Capillary: 209 mg/dL — ABNORMAL HIGH (ref 70–99)
Glucose-Capillary: 210 mg/dL — ABNORMAL HIGH (ref 70–99)
Glucose-Capillary: 178 mg/dL — ABNORMAL HIGH (ref 70–99)
Glucose-Capillary: 211 mg/dL — ABNORMAL HIGH (ref 70–99)

## 2010-06-09 LAB — BASIC METABOLIC PANEL
BUN: 21 mg/dL (ref 6–23)
CO2: 25 mEq/L (ref 19–32)
Calcium: 8.7 mg/dL (ref 8.4–10.5)
Creatinine, Ser: 0.93 mg/dL (ref 0.4–1.5)
GFR calc Af Amer: >60 mL/min (ref >60)

## 2010-06-09 LAB — CBC
HCT: 39.2 % (ref 39.0–52.0)
Hemoglobin: 12.8 g/dL — ABNORMAL LOW (ref 13.0–17.0)
MCHC: 32.7 g/dL (ref 30.0–36.0)
RBC: 4.25 MIL/uL (ref 4.22–5.81)
WBC: 11.5 10*3/uL — ABNORMAL HIGH (ref 4.0–10.5)

## 2010-06-10 ENCOUNTER — Inpatient Hospital Stay (HOSPITAL_COMMUNITY): Payer: Medicare Other

## 2010-06-10 DIAGNOSIS — R404 Transient alteration of awareness: Secondary | ICD-10-CM

## 2010-06-10 DIAGNOSIS — E119 Type 2 diabetes mellitus without complications: Secondary | ICD-10-CM

## 2010-06-10 LAB — BLOOD GAS, ARTERIAL
Acid-Base Excess: 0.6 mmol/L (ref 0.0–2.0)
Bicarbonate: 23.9 mEq/L (ref 20.0–24.0)
Drawn by: 252031
O2 Content: 2 L/min
O2 Saturation: 94.6 %
Patient temperature: 98.6
TCO2: 24.9 mmol/L (ref 0–100)
pCO2 arterial: 32.8 mmHg — ABNORMAL LOW (ref 35.0–45.0)
pO2, Arterial: 70.1 mmHg — ABNORMAL LOW (ref 80.0–100.0)

## 2010-06-10 LAB — CBC
HCT: 37.3 % — ABNORMAL LOW (ref 39.0–52.0)
Hemoglobin: 12.2 g/dL — ABNORMAL LOW (ref 13.0–17.0)
MCH: 30 pg (ref 26.0–34.0)
MCHC: 32.7 g/dL (ref 30.0–36.0)
MCV: 91.9 fL (ref 78.0–100.0)
RBC: 4.06 MIL/uL — ABNORMAL LOW (ref 4.22–5.81)

## 2010-06-10 LAB — BASIC METABOLIC PANEL
BUN: 19 mg/dL (ref 6–23)
CO2: 26 mEq/L (ref 19–32)
Calcium: 8.2 mg/dL — ABNORMAL LOW (ref 8.4–10.5)
Chloride: 109 mEq/L (ref 96–112)
Creatinine, Ser: 0.95 mg/dL (ref 0.4–1.5)
GFR calc Af Amer: >60 mL/min (ref >60)
Glucose, Bld: 192 mg/dL — ABNORMAL HIGH (ref 70–99)

## 2010-06-10 LAB — GLUCOSE, CAPILLARY
Glucose-Capillary: 175 mg/dL — ABNORMAL HIGH (ref 70–99)
Glucose-Capillary: 214 mg/dL — ABNORMAL HIGH (ref 70–99)

## 2010-06-11 LAB — GLUCOSE, CAPILLARY
Glucose-Capillary: 296 mg/dL — ABNORMAL HIGH (ref 70–99)
Glucose-Capillary: 209 mg/dL — ABNORMAL HIGH (ref 70–99)

## 2010-06-12 ENCOUNTER — Inpatient Hospital Stay (HOSPITAL_COMMUNITY): Payer: Medicare Other

## 2010-06-12 DIAGNOSIS — R404 Transient alteration of awareness: Secondary | ICD-10-CM

## 2010-06-12 DIAGNOSIS — J9600 Acute respiratory failure, unspecified whether with hypoxia or hypercapnia: Secondary | ICD-10-CM

## 2010-06-12 DIAGNOSIS — I609 Nontraumatic subarachnoid hemorrhage, unspecified: Secondary | ICD-10-CM

## 2010-06-12 DIAGNOSIS — E119 Type 2 diabetes mellitus without complications: Secondary | ICD-10-CM

## 2010-06-12 LAB — GLUCOSE, CAPILLARY

## 2010-06-13 LAB — GLUCOSE, CAPILLARY
Glucose-Capillary: 191 mg/dL — ABNORMAL HIGH (ref 70–99)
Glucose-Capillary: 170 mg/dL — ABNORMAL HIGH (ref 70–99)
Glucose-Capillary: 225 mg/dL — ABNORMAL HIGH (ref 70–99)
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
## 2010-06-17 ENCOUNTER — Other Ambulatory Visit: Payer: Self-pay | Admitting: Neurosurgery

## 2010-06-17 DIAGNOSIS — I609 Nontraumatic subarachnoid hemorrhage, unspecified: Secondary | ICD-10-CM

## 2010-06-17 LAB — GLUCOSE, CAPILLARY: Glucose-Capillary: 197 mg/dL — ABNORMAL HIGH (ref 70–99)

## 2010-07-02 ENCOUNTER — Other Ambulatory Visit: Payer: Medicare Other

## 2010-07-11 ENCOUNTER — Ambulatory Visit
Admission: RE | Admit: 2010-07-11 | Discharge: 2010-07-11 | Disposition: A | Discharge status: Home / Self Care | Payer: Medicare Other | Source: Ambulatory Visit | Attending: Neurosurgery | Admitting: Neurosurgery

## 2010-07-11 ENCOUNTER — Other Ambulatory Visit: Payer: Self-pay | Admitting: Neurosurgery

## 2010-07-11 DIAGNOSIS — R51 Headache: Secondary | ICD-10-CM

## 2010-07-11 DIAGNOSIS — I609 Nontraumatic subarachnoid hemorrhage, unspecified: Secondary | ICD-10-CM

## 2010-07-23 ENCOUNTER — Ambulatory Visit: Payer: Self-pay | Admitting: Internal Medicine

## 2010-07-30 NOTE — H&P (Signed)
Garrett [name], Garrett [name]                ACCOUNT NO.:  [id number]   MEDICAL RECORD NO.:  [id number]           PATIENT TYPE:  AMB   LOCATION:  DAY                           FACILITY:  [facility]   PHYSICIAN:  Dalia Heading, M.D.  DATE OF BIRTH:  Feb 08, 1934   DATE OF ADMISSION:  DATE OF DISCHARGE:  LH                              HISTORY & PHYSICAL   CHIEF COMPLAINT:  Need for screening colonoscopy.   HISTORY OF PRESENT ILLNESS:  The patient is a 77 year old white male who  has referred for endoscopic evaluation.  He needs a colonoscopy for  screening purposes.  No abdominal pain, weight loss, nausea, vomiting,  diarrhea, constipation, melena, and hematochezia have been noted.  He  last had a colonoscopy over 10 years ago.  There is no family history of  colon carcinoma.   PAST MEDICAL HISTORY:  Include insulin-dependent diabetes mellitus.   PAST SURGICAL HISTORY:  Herniorrhaphy.   CURRENT MEDICATIONS:  Metformin, Januvia, and insulin.   ALLERGIES:  No known drug allergies.   REVIEW OF SYSTEMS:  Noncontributory.   PHYSICAL EXAMINATION:  The patient is a well-developed, well-nourished  white male in no acute distress.  LUNGS:  Clear to auscultation with equal breath sounds bilaterally.  HEART:  Reveals regular rate and rhythm without S3, S4, or murmur.  ABDOMEN:  Soft, nontender, and nondistended.  No hepatosplenomegaly or  masses are noted.  RECTAL:  Deferred to the procedure.   IMPRESSION:  Need for screening colonoscopy.   PLAN:  The patient scheduled for colonoscopy on September 07, 2007.  The  risks and benefits of the procedure including bleeding and perforation  were fully explained to the patient, and gain informed consent.      Dalia Heading, M.D.  Electronically Signed     MAJ/MEDQ  D:  08/31/2007  T:  09/01/2007  Job:  [id number]   cc:   Catalina Pizza, M.D.  Fax: [phone]

## 2010-07-30 NOTE — Assessment & Plan Note (Signed)
[facility]                             PULMONARY OFFICE NOTE   Garrett [name], Garrett [name]                       MRN:          [id number]  DATE:01/28/2007                            DOB:          [birth year]    PROBLEM:  A 77 year old man comes seeking my opinion about chronic cough  with possible allergy component.   HISTORY:  He had worked with Dr. Shelle [name] up until November 2007, with  diagnosis of cyclical cough, question also laryngopharyngeal reflux.  Spirometry had been normal.  CT of the sinuses had shown mild changes of  chronic sinusitis without acute sinusitis.  Chest x-ray had shown  diffuse peribronchial thickening compatible with chronic bronchitis and  also some healed left rib fractures.  A methacholine inhalation  challenge test on February 11, 2006, had been normal, negative for  hyperreactive airways and the associated spirometry was also normal.  ENT evaluation with Dr. Pollyann [name] had not identified a head or neck basis  for cough with no evidence of sinus disease or reflux.  Garrett [name] says  triggers include smoke and strong odors.  Intensity of cough varies from  day-to-day.  This has been going on several years.  He is aware of some  post nasal drainage and a lot of nasal stuffiness and discharge.  He  thinks he is worse in the Spring and Fall.  No better at the beach.   MEDICATIONS:  1. Januvia 100 mg.  2. Lantus 15 units.  3. Aspirin 81 mg.  4. Singulair 10 mg.   No medication allergy.   REVIEW OF SYSTEMS:  Nonproductive cough, nasal congestion.  He denies  any awareness of reflux, chest pain, palpitations, fever, discolored  sputum, adenopathy, rash or edema.   PAST HISTORY:  1. Heart catheterization, okay.  2. Diabetes.  3. Allergic rhinitis.  4. No history of pneumonia or asthma.  5. Normal upper endoscopy in 1999, although he carried a history of      GERD at that time.   No intolerance to latex, contrast dye, or  aspirin.   SOCIAL HISTORY:  Recently married.  Quit smoking cigars in 1998.  He is  retired from the [name] where he [employer].  There were warning signs in the work place against exposure to  carcinogens but nothing about respiratory risks.   FAMILY HISTORY:  Sister coughs a lot.  Family history of heart disease.   OBJECTIVE:  VITAL SIGNS:  Weight  185 pounds, pulse regular 69, room air  saturation 97%.  GENERAL:  Not in obvious distress.  Demonstrates dry cough.  NOSE/THROAT/EARS:  All clear.  CHEST:  Sounds clear.  No stridor.  Voice quality normal.  HEART:  Sounds normal without murmur.   Skin test:  Positive histamine, negative diluent controls.  Positive  reactions for grass, fall weed, selected tree pollens, and especially  for dust and dust mite.   IMPRESSION:  Cough which has been resistant to previous workups.  There  are clues suggesting this may have an allergic basis, possibly as an  allergic cough equivalent  asthma or mild allergic rhinitis.   PLAN:  1. Tessalon Perles.  2. Phenergan with codeine cough syrup 5 ml q.6 h. p.r.n.  3. RAST IgE panel.  4. CBC with diff.  5. Schedule return 1 month.  6. Environmental precautions discussed.     Garrett D. Maple Hudson, MD, [facility] [name], FACP  Electronically Signed    CDY/MedQ  DD: 01/30/2007  DT: 01/31/2007  Job #: [id number]   cc:   Melvyn Novas, MD

## 2010-08-02 NOTE — Assessment & Plan Note (Signed)
[facility]                               PULMONARY OFFICE NOTE   Garrett [name], Garrett [name]                       MRN:          [id number]  DATE:11/03/2005                            DOB:          10-03-1933    HISTORY OF PRESENT ILLNESS:  The patient is a very pleasant 77 year old  white male who comes in today for evaluation of cough.  The patient states  that he has had cough for several years but it has gotten to the point where  it is greatly affecting his quality of life.  The patient states that it  does not awaken him at night and it is quite variable during the day.  It is  no worse with lying down.  He states that the stimulus for his cough comes  from a tickle in his throat and he does note a lot of throat clearing as  well.  The cough is dry in nature and clearly worse with periods of  conversation and laughing.  He denies postnasal drip but does have  occasional GERD.  He has a history of GERD which dates back to the past for  which it was improved with proton pump inhibitor.  To the patient's  knowledge, he has never been diagnosed with any pulmonary disease. He has  had a chest x-ray a few months ago at [facility] that did show peribronchial  thickening.  Otherwise, it was noncontributory.   PAST MEDICAL HISTORY:  1. Diabetes.  2. A history of allergic rhinitis.  3. A history of bilateral inguinal hernia repair in 1999.   MEDICATIONS:  1. Januvia 100 mg daily.  2. Lantus 15 units daily.  3. Aspirin 81 mg daily.   ALLERGIES:  The patient has no known drug allergies.   SOCIAL HISTORY:  He has a history of smoking cigars for 20 years.  He has  not smoked since 1999.  He has never smoked cigarettes.  The patient is  widowed and does have children.  He is retired from [employer] where he worked  in the [employer].   FAMILY HISTORY:  Family history is remarkable for his mother having heart  disease.   REVIEW OF  SYSTEMS:  Review of systems as per history of present illness,  also see patient intake form back in the chart.   PHYSICAL EXAMINATION:  GENERAL:  In general, he is a well-developed white  male in no acute distress.  VITAL SIGNS:  Blood pressure 106/68.  Pulse 68.  Temperature 97.6. Weight is  192 pounds.  O2 saturation on room air is 94%.  HEENT:  Pupils are equal, round, and reactive to light and accommodation.  Extraocular muscles are intact.  Nares are patent without discharge.  Oropharynx is clear.  NECK:  Supple without JVD or lymphadenopathy. There is no palpable  thyromegaly.  CHEST:  Chest is totally clear to auscultation.  CARDIAC:  Exam reveals regular rate and rhythm; no murmurs, rubs, or  gallops.  ABDOMEN:  Soft, nontender, with good bowel sounds.  GENITAL/RECTAL/BREASTS:  Exams not  done and not indicated.  EXTREMITIES:  Lower extremities are without edema.  Pulses are intact  distally.  NEUROLOGIC:  Neurologically, alert and oriented with no obvious motor  deficits.   IMPRESSION:  Cough that I suspect is upper airway in origin.  I am very  concerned about the possibility of laryngopharyngeal reflux as well as a  component of postnasal drip. The history really is not supportive or  suggestive of a lower airway source but this will have to kept in mind.  There is clearly a little bit of a cyclical component to this.   PLAN:  1. A trial of BroveX 5 ml nightly for the next 2 weeks.  2. Aciphex 20 mg p.o. b.i.d.  3. I have gone over cyclical cough measures with the patient and he will      try to stick with this.  4. Follow up in 4 weeks or sooner if there are problems.                                   Barbaraann Share, MD,FCCP   [facility]/MedQ  DD:  12/01/2005  DT:  12/02/2005  Job #:  [id number]   cc:   Melvyn Novas, MD

## 2010-08-15 ENCOUNTER — Ambulatory Visit
Admission: RE | Admit: 2010-08-15 | Discharge: 2010-08-15 | Disposition: A | Discharge status: Home / Self Care | Payer: Medicare Other | Source: Ambulatory Visit | Attending: Neurosurgery | Admitting: Neurosurgery

## 2010-08-15 DIAGNOSIS — R51 Headache: Secondary | ICD-10-CM

## 2010-12-06 LAB — DIFFERENTIAL
Basophils Relative: 0
Eosinophils Absolute: 0
Neutrophils Relative %: 80 — ABNORMAL HIGH

## 2010-12-06 LAB — CBC
MCHC: 33.6
MCV: 91.8
Platelets: 255

## 2010-12-06 LAB — I-STAT 8, (EC8 V) (CONVERTED LAB)
Acid-base deficit: 2
BUN: 18
HCT: 50
Sodium: 134 — ABNORMAL LOW
TCO2: 23
pCO2, Ven: 33.4 — ABNORMAL LOW
pH, Ven: 7.423 — ABNORMAL HIGH

## 2010-12-06 LAB — URINALYSIS, ROUTINE W REFLEX MICROSCOPIC
Leukocytes, UA: NEGATIVE
Protein, ur: NEGATIVE
Urobilinogen, UA: 0.2

## 2010-12-06 LAB — POCT CARDIAC MARKERS: Troponin i, poc: <0.05

## 2010-12-06 LAB — URINE MICROSCOPIC-ADD ON

## 2011-02-24 ENCOUNTER — Other Ambulatory Visit: Payer: Self-pay | Admitting: Surgery

## 2011-02-24 DIAGNOSIS — I712 Thoracic aortic aneurysm, without rupture: Secondary | ICD-10-CM

## 2011-04-08 ENCOUNTER — Ambulatory Visit: Payer: Medicare Other | Admitting: Surgery

## 2011-04-08 ENCOUNTER — Other Ambulatory Visit: Payer: Medicare Other

## 2011-04-15 ENCOUNTER — Ambulatory Visit: Payer: Medicare Other | Admitting: Surgery

## 2011-04-15 ENCOUNTER — Inpatient Hospital Stay: Admission: RE | Admit: 2011-04-15 | Payer: Medicare Other | Source: Ambulatory Visit

## 2012-01-02 ENCOUNTER — Ambulatory Visit (HOSPITAL_COMMUNITY)
Admission: RE | Admit: 2012-01-02 | Discharge: 2012-01-02 | Disposition: A | Discharge status: Home / Self Care | Payer: Medicare Other | Source: Ambulatory Visit | Attending: Internal Medicine | Admitting: Internal Medicine

## 2012-01-02 DIAGNOSIS — L97809 Non-pressure chronic ulcer of other part of unspecified lower leg with unspecified severity: Secondary | ICD-10-CM | POA: Insufficient documentation

## 2012-01-02 DIAGNOSIS — S81802A Unspecified open wound, left lower leg, initial encounter: Secondary | ICD-10-CM | POA: Insufficient documentation

## 2012-01-02 DIAGNOSIS — IMO0001 Reserved for inherently not codable concepts without codable children: Secondary | ICD-10-CM | POA: Insufficient documentation

## 2012-01-02 NOTE — Progress Notes (Signed)
Physical Therapy - Wound Therapy  Evaluation   Patient Details  Name: Garrett [name] MRN: [id number] Date of Birth: [birth year]  Today's Date: 01/02/2012 Time: 4098-1191 Time Calculation (min): 44 min Charges: 1 eval, 1 selective Deb > 20 cm Visit#: 1  of 8   Re-eval: 02/01/12  Subjective Subjective Assessment Subjective: Pt reports that about 1 month ago he hit the front of his shin and it has started to heal.  Then reports that about 2 weeks ago on his calf region that he noticed a big blister had popped and became very sore and painful.   He lives by himself and his niece comes to check in with him daily.  He has 5 children and most are involved with care, however each works and reports he has a son who is very ill.  Date of Onset: 12/12/11 Prior Treatments: antibiotics and wrapping by nursing and MD  Pain Assessment Pain Assessment Pain Assessment: Faces Faces Pain Scale: Hurts little more Pain Type: Acute pain Pain Location: Leg Pain Orientation: Left  Wound Therapy Wound 01/02/12 Blister (Blood filled) Leg Left;Posterior;Distal Calf wound (Active)  Site / Wound Assessment Pale;Pink;Yellow 01/02/2012  3:26 PM  % Wound base Red or Granulating 10% 01/02/2012  3:26 PM  % Wound base Yellow 90% 01/02/2012  3:26 PM  Wound Length (cm) 7 cm 01/02/2012  3:26 PM  Wound Width (cm) 5.3 cm 01/02/2012  3:26 PM  Wound Depth (cm) 0 cm 01/02/2012  3:26 PM  Margins Unattacted edges (unapproximated) 01/02/2012  3:26 PM  Drainage Amount Moderate 01/02/2012  3:26 PM  Drainage Description Serosanguineous 01/02/2012  3:26 PM  Treatment Cleansed;Debridement (Selective) 01/02/2012  3:26 PM  Dressing Type Compression wrap;Gauze (Comment);Silver dressings 01/02/2012  3:26 PM  Dressing Changed New 01/02/2012  3:26 PM  Dressing Status Clean;Dry;Intact 01/02/2012  3:26 PM     Wound 01/02/12 Abrasion(s) Leg Left;Anterior;Proximal most proximal tibial wound  (Active)  Site / Wound Assessment Yellow  01/02/2012  3:26 PM  % Wound base Red or Granulating 100% 01/02/2012  3:26 PM  % Wound base Yellow 0% 01/02/2012  3:26 PM  Peri-wound Assessment Edema 01/02/2012  3:26 PM  Wound Length (cm) 2.5 cm 01/02/2012  3:26 PM  Wound Width (cm) 1.2 cm 01/02/2012  3:26 PM  Wound Depth (cm) 0 cm 01/02/2012  3:26 PM  Drainage Amount Minimal 01/02/2012  3:26 PM  Drainage Description Serous 01/02/2012  3:26 PM  Treatment Cleansed;Debridement (Selective) 01/02/2012  3:26 PM  Dressing Type Alginate;Compression wrap;Gauze (Comment) 01/02/2012  3:26 PM  Dressing Changed New 01/02/2012  3:26 PM  Dressing Status Clean;Dry;Intact 01/02/2012  3:26 PM     Wound 01/02/12 Abrasion(s) Leg Left;Mid;Anterior middle tibial wound (Active)  Site / Wound Assessment Yellow 01/02/2012  3:26 PM  % Wound base Red or Granulating 0% 01/02/2012  3:26 PM  % Wound base Yellow 100% 01/02/2012  3:26 PM  Wound Length (cm) 4.7 cm 01/02/2012  3:26 PM  Wound Width (cm) 2.5 cm 01/02/2012  3:26 PM  Wound Depth (cm) 0 cm 01/02/2012  3:26 PM  Margins Unattacted edges (unapproximated) 01/02/2012  3:26 PM  Drainage Amount Minimal 01/02/2012  3:26 PM  Drainage Description Serous 01/02/2012  3:26 PM  Treatment Cleansed;Debridement (Selective) 01/02/2012  3:26 PM  Dressing Type Alginate;Compression wrap;Gauze (Comment) 01/02/2012  3:26 PM  Dressing Changed Changed 01/02/2012  3:26 PM  Dressing Status Clean;Dry;Intact 01/02/2012  3:26 PM     Wound 01/02/12 Abrasion(s) Leg Left;Distal;Anterior lower tibial wound (Active)  Site /  Wound Assessment Yellow 01/02/2012  3:26 PM  % Wound base Red or Granulating 0% 01/02/2012  3:26 PM  % Wound base Yellow 100% 01/02/2012  3:26 PM  Peri-wound Assessment Edema 01/02/2012  3:26 PM  Wound Length (cm) 2.2 cm 01/02/2012  3:26 PM  Wound Width (cm) 1.8 cm 01/02/2012  3:26 PM  Wound Depth (cm) 0 cm 01/02/2012  3:26 PM  Margins Unattacted edges (unapproximated) 01/02/2012  3:26 PM  Drainage Amount  Minimal 01/02/2012  3:26 PM  Drainage Description Serous 01/02/2012  3:26 PM  Treatment Cleansed;Debridement (Selective) 01/02/2012  3:26 PM  Dressing Type Alginate;Compression wrap;Gauze (Comment) 01/02/2012  3:26 PM   Selective Debridement Selective Debridement - Location: to L lower leg anterior and posterior  Selective Debridement - Tools Used: Scalpel Selective Debridement - Tissue Removed: Slough    Physical Therapy Assessment and Plan Wound Therapy - Assess/Plan/Recommendations Wound Therapy - Clinical Statement: [facility] [name] presents today with anterior and posterior wounds to his left lower leg which started about 1 month ago when he hit his shin.  Due to increased edema to his LE (from DMII as well as likely from recent injury) may have caused a blister to the posterior calf.  He will benefit from skilled OP PT for wound therapy.   Recommend compression stockings to decrease edema to improve wound healing.  Factors Delaying/Impairing Wound Healing: Altered sensation;Diabetes Mellitus Hydrotherapy Plan: Debridement;Dressing change;Patient/family education Wound Therapy - Frequency: Other (comment) (2x/week x4 weeks) Wound Plan: Continue with debridment and dressing changes.  Continue with compression dressing as needed.  Educated pt on importance of compression.      Goals Wound Therapy Goals - Improve the function of patient's integumentary system by progressing the wound(s) through the phases of wound healing by: Decrease Necrotic Tissue to: 0% Decrease Necrotic Tissue - Progress: Goal set today Increase Granulation Tissue to: 100% Increase Granulation Tissue - Progress: Goal set today Decrease Length/Width/Depth by (cm): Calf Wound: 2x3x0.  Anterior Wonds: Proximal: 0x0x0, Mid: 1x1, Distal: 0x0x0 Improve Drainage Characteristics: Min Improve Drainage Characteristics - Progress: Goal set today Patient/Family will be able to : Pt unable to as he lives by himself and has limited  ROM  Problem List Patient Active Problem List  Diagnosis  . CERUMEN IMPACTION  . ACUTE BRONCHITIS  . RHINOSINUSITIS, CHRONIC  . ALLERGIC RHINITIS  . GERD  . COUGH  . DIABETES MELLITUS, HX OF    GP Functional Limitation: Self care;Other PT primary Other PT Primary Current Status [phone]): At least 60 percent but less than 80 percent impaired, limited or restricted Other PT Primary Goal Status (U0454): At least 1 percent but less than 20 percent impaired, limited or restricted  Shateria Paternostro 01/02/2012, 3:42 PM

## 2012-01-06 ENCOUNTER — Ambulatory Visit (HOSPITAL_COMMUNITY)
Admission: RE | Admit: 2012-01-06 | Discharge: 2012-01-06 | Disposition: A | Discharge status: Home / Self Care | Payer: Medicare Other | Source: Ambulatory Visit

## 2012-01-06 DIAGNOSIS — L97909 Non-pressure chronic ulcer of unspecified part of unspecified lower leg with unspecified severity: Secondary | ICD-10-CM | POA: Insufficient documentation

## 2012-01-06 NOTE — Progress Notes (Signed)
Physical Therapy - Wound Therapy  Treatment   Patient Details  Name: Garrett [name] MRN: [id number] Date of Birth: 03-20-1933  Today's Date: 01/06/2012 Time: 4098-1191 Time Calculation (min): 45 min Charges:  Deb >20cm Visit#: 2  of 8   Re-eval: 02/01/12  Subjective Subjective Assessment Subjective: Pt. reports his bandage started slipping down.  Reports no pain or discomfort from bandages.l  Pain Assessment Pain Assessment Pain Assessment: No/denies pain  Wound Therapy Wound 01/02/12 Blister (Blood filled) Leg Left;Posterior;Distal Calf wound (Active)  Site / Wound Assessment Pale;Pink;Yellow 01/06/2012  6:14 PM  % Wound base Red or Granulating 20% 01/06/2012  6:14 PM  % Wound base Yellow 80% 01/06/2012  6:14 PM  Peri-wound Assessment Maceration 01/06/2012  6:14 PM  Wound Length (cm) 7 cm 01/02/2012  3:26 PM  Wound Width (cm) 5.3 cm 01/02/2012  3:26 PM  Wound Depth (cm) 0 cm 01/02/2012  3:26 PM  Margins Unattacted edges (unapproximated) 01/06/2012  6:14 PM  Drainage Amount Moderate 01/06/2012  6:14 PM  Drainage Description Serosanguineous 01/06/2012  6:14 PM  Treatment Cleansed;Debridement (Selective) 01/06/2012  6:14 PM  Dressing Type Silver hydrofiber;Compression wrap 01/06/2012  6:14 PM  Dressing Changed New 01/06/2012  6:14 PM  Dressing Status Clean;Dry;Intact 01/06/2012  6:14 PM     Wound 01/02/12 Abrasion(s) Leg Left;Anterior;Proximal most proximal tibial wound  (Active)  Site / Wound Assessment Granulation tissue 01/06/2012  6:14 PM  % Wound base Red or Granulating 100% 01/06/2012  6:14 PM  % Wound base Yellow 0% 01/06/2012  6:14 PM  Peri-wound Assessment Edema;Erythema (blanchable) 01/06/2012  6:14 PM  Wound Length (cm) 2.5 cm 01/02/2012  3:26 PM  Wound Width (cm) 1.2 cm 01/02/2012  3:26 PM  Wound Depth (cm) 0 cm 01/02/2012  3:26 PM  Drainage Amount Minimal 01/06/2012  6:14 PM  Drainage Description Serous 01/06/2012  6:14 PM  Treatment Cleansed;Debridement  (Selective) 01/06/2012  6:14 PM  Dressing Type Moist to moist;Gauze (Comment);Compression wrap 01/06/2012  6:14 PM  Dressing Changed New 01/06/2012  6:14 PM  Dressing Status Clean;Dry;Intact 01/06/2012  6:14 PM     Wound 01/02/12 Abrasion(s) Leg Left;Mid;Anterior middle tibial wound (Active)  Site / Wound Assessment Granulation tissue 01/06/2012  6:14 PM  % Wound base Red or Granulating 100% 01/06/2012  6:14 PM  % Wound base Yellow 0% 01/06/2012  6:14 PM  Peri-wound Assessment Erythema (blanchable) 01/06/2012  6:14 PM  Wound Length (cm) 4.7 cm 01/02/2012  3:26 PM  Wound Width (cm) 2.5 cm 01/02/2012  3:26 PM  Wound Depth (cm) 0 cm 01/02/2012  3:26 PM  Margins Attached edges (approximated) 01/06/2012  6:14 PM  Drainage Amount Minimal 01/06/2012  6:14 PM  Drainage Description Serous 01/06/2012  6:14 PM  Treatment Cleansed;Debridement (Selective) 01/06/2012  6:14 PM  Dressing Type Moist to moist;Gauze (Comment);Compression wrap 01/06/2012  6:14 PM  Dressing Changed New 01/06/2012  6:14 PM  Dressing Status Clean;Dry;Intact 01/06/2012  6:14 PM     Wound 01/02/12 Abrasion(s) Leg Left;Distal;Anterior lower tibial wound (Active)  Site / Wound Assessment Granulation tissue 01/06/2012  6:14 PM  % Wound base Red or Granulating 100% 01/06/2012  6:14 PM  % Wound base Yellow 0% 01/06/2012  6:14 PM  Peri-wound Assessment Edema;Erythema (blanchable) 01/06/2012  6:14 PM  Wound Length (cm) 2.2 cm 01/02/2012  3:26 PM  Wound Width (cm) 1.8 cm 01/02/2012  3:26 PM  Wound Depth (cm) 0 cm 01/02/2012  3:26 PM  Margins Attached edges (approximated) 01/06/2012  6:14 PM  Drainage Amount  Minimal 01/06/2012  6:14 PM  Drainage Description Serous 01/06/2012  6:14 PM  Treatment Cleansed;Debridement (Selective) 01/06/2012  6:14 PM  Dressing Type Moist to moist;Hydrogel;Compression wrap 01/06/2012  6:14 PM   Selective Debridement Selective Debridement - Location: to L lower leg anterior and posterior  Selective  Debridement - Tools Used: Scalpel;Forceps Selective Debridement - Tissue Removed: Slough, dry skin   Physical Therapy Assessment and Plan Wound Therapy - Assess/Plan/Recommendations Wound Therapy - Clinical Statement: All wounds anterior LE are now fully granulated; switched dressings to moist to moist using hydrogel.  anticipate closure next visit.  Posterior wound continues to drain moderately and perimeter remains macerated.  Applied silver hydrofiber to wound to wick moisture.  Pt./son encouraged to keep distal LE floating or position self in side lying to decrease pressure on posterior LE and allow healing. Wound Plan: Continue with debridment and dressing changes.  Continue with compression dressing as needed.  Educated pt on importance of compression.       Problem List Patient Active Problem List  Diagnosis  . CERUMEN IMPACTION  . ACUTE BRONCHITIS  . RHINOSINUSITIS, CHRONIC  . ALLERGIC RHINITIS  . GERD  . COUGH  . DIABETES MELLITUS, HX OF  . Leg wound, left  . Ulcer of lower limb, unspecified     Lurena Nida, PTA/CLT 01/06/2012, 6:26 PM

## 2012-01-09 ENCOUNTER — Ambulatory Visit (HOSPITAL_COMMUNITY)
Admission: RE | Admit: 2012-01-09 | Discharge: 2012-01-09 | Disposition: A | Discharge status: Home / Self Care | Payer: Medicare Other | Source: Ambulatory Visit | Attending: Internal Medicine | Admitting: Internal Medicine

## 2012-01-09 NOTE — Progress Notes (Signed)
Physical Therapy - Wound Therapy  Treatment   Patient Details  Name: Garrett [name] MRN: [id number] Date of Birth: Sep 05, 1933  Today's Date: 01/09/2012 Time: 4098-1191 Time Calculation (min): 32 min  Visit#: 3  of 8   Re-eval: 02/02/12 Charge: selective debridement >20 cm  Subjective Subjective Assessment Subjective: Feeling good, no pain today.  Pain Assessment Pain Assessment Pain Assessment: No/denies pain  Wound therapy  01/09/12 1913  Subjective Assessment  Subjective Feeling good, no pain today.  Wound 01/02/12 Blister (Blood filled) Leg Left;Posterior;Distal Calf wound  Date First Assessed: 01/02/12   Wound Type: Blister (Blood filled)  Location: Leg  Location Orientation: Left;Posterior;Distal  Wound Description (Comments): Calf wound  Site / Wound Assessment Pale;Pink;Yellow  % Wound base Red or Granulating 30%  % Wound base Yellow 70%  Peri-wound Assessment Maceration  Margins Unattacted edges (unapproximated)  Drainage Amount Moderate  Drainage Description Serosanguineous  Treatment Cleansed;Debridement (Selective)  Dressing Type Silver hydrofiber;Compression wrap  Dressing Changed New  Dressing Status Clean;Dry;Intact  Wound 01/02/12 Abrasion(s) Leg Left;Anterior;Proximal most proximal tibial wound   Date First Assessed: 01/02/12   Wound Type: Abrasion(s)  Location: Leg  Location Orientation: Left;Anterior;Proximal  Wound Description (Comments): most proximal tibial wound   Site / Wound Assessment Granulation tissue  % Wound base Red or Granulating 100%  Drainage Amount Scant  Drainage Description Serous  Treatment Cleansed;Debridement (Selective)  Dressing Type Hydrogel;Moist to moist;Compression wrap;Gauze (Comment)  Dressing Changed New  Dressing Status Clean;Dry;Intact  Wound 01/02/12 Abrasion(s) Leg Left;Mid;Anterior middle tibial wound  Date First Assessed: 01/02/12   Wound Type: Abrasion(s)  Location: Leg  Location Orientation: Left;Mid;Anterior   Wound Description (Comments): middle tibial wound  Site / Wound Assessment Granulation tissue  % Wound base Red or Granulating 100%  Margins Attached edges (approximated)  Drainage Amount Scant  Drainage Description Serous  Treatment Cleansed;Debridement (Selective)  Dressing Type Moist to moist;Hydrogel;Gauze (Comment);Compression wrap  Dressing Changed New  Dressing Status Clean;Dry;Intact  Wound 01/02/12 Abrasion(s) Leg Left;Distal;Anterior lower tibial wound  Date First Assessed: 01/02/12   Wound Type: Abrasion(s)  Location: Leg  Location Orientation: Left;Distal;Anterior  Wound Description (Comments): lower tibial wound  Site / Wound Assessment Granulation tissue  % Wound base Red or Granulating 100%  % Wound base Yellow 0%  Margins Attached edges (approximated)  Drainage Amount Scant  Drainage Description Serous  Treatment Cleansed;Debridement (Selective)  Dressing Type Moist to moist;Hydrogel;Gauze (Comment);Compression wrap  Selective Debridement  Selective Debridement - Location to L lower leg anterior and posterior   Selective Debridement - Tools Used Forceps  Selective Debridement - Tissue Removed Slough, dry skin  Wound Therapy - Assess/Plan/Recommendations  Wound Therapy - Clinical Statement All wounds are full granulated anterior shin.  Dressings complete anterior shin with moist to moist with hydrogel, gauze and compression.  Continues with silver hydrofiber to address the moderate drainage posterior calf.  Noted decreased edema L LE with compression, pt educated on benefits of compression to help decrease the edema.  Wound Plan Continue with debridment and dressing changes.  Continue with compression dressing as needed.  Educated pt on importance of compression.     Selective Debridement Selective Debridement - Location: to L lower leg anterior and posterior  Selective Debridement - Tools Used: Forceps Selective Debridement - Tissue Removed: Slough, dry skin    Physical Therapy Assessment and Plan Wound Therapy - Assess/Plan/Recommendations Wound Therapy - Clinical Statement: All wounds are full granulated anterior shin.  Dressings complete anterior shin with moist to moist with hydrogel, gauze  and compression.  Continues with silver hydrofiber to address the moderate drainage posterior calf.  Noted decreased edema L LE with compression, pt educated on benefits of compression to help decrease the edema. Wound Plan: Continue with debridment and dressing changes.  Continue with compression dressing as needed.  Educated pt on importance of compression.      Goals    Problem List Patient Active Problem List  Diagnosis  . CERUMEN IMPACTION  . ACUTE BRONCHITIS  . RHINOSINUSITIS, CHRONIC  . ALLERGIC RHINITIS  . GERD  . COUGH  . DIABETES MELLITUS, HX OF  . Leg wound, left  . Ulcer of lower limb, unspecified    GP    Juel Burrow 01/09/2012, 7:21 PM

## 2012-01-13 ENCOUNTER — Ambulatory Visit (HOSPITAL_COMMUNITY)
Admission: RE | Admit: 2012-01-13 | Discharge: 2012-01-13 | Disposition: A | Discharge status: Home / Self Care | Payer: Medicare Other | Source: Ambulatory Visit

## 2012-01-13 NOTE — Progress Notes (Addendum)
Physical Therapy - Wound Therapy  Treatment   Patient Details  Name: Garrett [name] MRN: [id number] Date of Birth: [birth year]  Today's Date: 01/13/2012 Time: 4098-1191 Time Calculation (min): 1360 min  Visit#: 4  of 8   Re-eval: 02/02/12 Charge: selective debridement >20 cm  Subjective Subjective Assessment Subjective: Feeling good, no pain since the first time I came here.  Pain Assessment Pain Assessment Pain Assessment: No/denies pain  Wound Therapy  01/13/12 1846  Subjective Assessment  Subjective Feeling good, no pain since the first time I came here.  Wound 01/02/12 Blister (Blood filled) Leg Left;Posterior;Distal Calf wound  Date First Assessed: 01/02/12   Wound Type: Blister (Blood filled)  Location: Leg  Location Orientation: Left;Posterior;Distal  Wound Description (Comments): Calf wound  Site / Wound Assessment Pale;Pink;Yellow  % Wound base Red or Granulating 50%  % Wound base Yellow 50%  Margins Unattacted edges (unapproximated)  Drainage Amount Moderate  Drainage Description Serosanguineous  Dressing Type Silver hydrofiber;Compression wrap  Dressing Status Clean;Dry;Intact  Wound 01/02/12 Abrasion(s) Leg Left;Mid;Anterior middle tibial wound  Date First Assessed: 01/02/12   Wound Type: Abrasion(s)  Location: Leg  Location Orientation: Left;Mid;Anterior  Wound Description (Comments): middle tibial wound  Site / Wound Assessment Granulation tissue  % Wound base Red or Granulating 100%  % Wound base Yellow 0%  Peri-wound Assessment Erythema (blanchable)  Margins Attached edges (approximated)  Drainage Amount Scant  Drainage Description Serous  Dressing Type Moist to moist;Hydrogel;Gauze (Comment)  Dressing Status Clean;Dry;Intact  Selective Debridement  Selective Debridement - Location to L lower leg anterior and posterior   Selective Debridement - Tools Used Forceps  Selective Debridement - Tissue Removed Slough, dry skin  Wound Therapy -  Assess/Plan/Recommendations  Wound Therapy - Clinical Statement Noted increased edema distal dressings.  Pt educated on ankle pumps and LE elevation above heart to help reduce the edema.  Pt encouraged to complete 100 ankle pumps throughout a day to help reduce.  Dressings placed starting metatarsal region for compression to also assist with edema reduction.  Pt tolerated well towards treatment with all anterior shin wound 100% granulated.  Able to remove dry skin and slough with moderate draingage with no c/o from pt.  Wound Plan Continue with debridment and dressing changes. Continue with compression dressing as needed.     Selective Debridement Selective Debridement - Location: to L lower leg anterior and posterior  Selective Debridement - Tools Used: Forceps Selective Debridement - Tissue Removed: Slough, dry skin   Physical Therapy Assessment and Plan Wound Therapy - Assess/Plan/Recommendations Wound Therapy - Clinical Statement: Noted increased edema distal dressings.  Pt educated on ankle pumps and LE elevation above heart to help reduce the edema.  Pt encouraged to complete 100 ankle pumps throughout a day to help reduce.  Dressings placed starting metatarsal region for compression to also assist with edema reduction.  Pt tolerated well towards treatment with all anterior shin wound 100% granulated.  Able to remove dry skin and slough with moderate draingage with no c/o from pt. Wound Plan: Continue with debridment and dressing changes. Continue with compression dressing as needed.       Goals    Problem List Patient Active Problem List  Diagnosis  . CERUMEN IMPACTION  . ACUTE BRONCHITIS  . RHINOSINUSITIS, CHRONIC  . ALLERGIC RHINITIS  . GERD  . COUGH  . DIABETES MELLITUS, HX OF  . Leg wound, left  . Ulcer of lower limb, unspecified    GP    Becky Sax  Alvino Chapel 01/13/2012, 6:49 PM

## 2012-01-16 ENCOUNTER — Ambulatory Visit (HOSPITAL_COMMUNITY)
Admission: RE | Admit: 2012-01-16 | Discharge: 2012-01-16 | Disposition: A | Discharge status: Home / Self Care | Payer: Medicare Other | Source: Ambulatory Visit | Attending: Internal Medicine | Admitting: Internal Medicine

## 2012-01-16 DIAGNOSIS — L97809 Non-pressure chronic ulcer of other part of unspecified lower leg with unspecified severity: Secondary | ICD-10-CM | POA: Insufficient documentation

## 2012-01-16 DIAGNOSIS — IMO0001 Reserved for inherently not codable concepts without codable children: Secondary | ICD-10-CM | POA: Insufficient documentation

## 2012-01-16 NOTE — Progress Notes (Signed)
Physical Therapy - Wound Therapy  Treatment   Patient Details  Name: Garrett [name] MRN: [id number] Date of Birth: October 17, 1933  Today's Date: 01/16/2012 Time: 1350-1430 Time Calculation (min): 40 min  Visit#: 5  of 8   Re-eval: 02/02/12 Charge: selective debridement < 20 cm Subjective Subjective Assessment Subjective: No pain, I have been doing my homework.  Noted decreased edema L LE especially foot.  Pain Assessment Pain Assessment Pain Assessment: No/denies pain  Wound Therapy  01/16/12 1723  Subjective Assessment  Subjective No pain, I have been doing my homework.  Noted decreased edema L LE especially foot.  Wound 01/02/12 Blister (Blood filled) Leg Left;Posterior;Distal Calf wound  Date First Assessed: 01/02/12   Wound Type: Blister (Blood filled)  Location: Leg  Location Orientation: Left;Posterior;Distal  Wound Description (Comments): Calf wound  Site / Wound Assessment Pale;Pink  % Wound base Red or Granulating 80%  % Wound base Yellow 20%  Margins Unattacted edges (unapproximated)  Drainage Amount Minimal  Drainage Description Serosanguineous  Dressing Type Moist to moist;Hydrogel;Gauze (Comment);Compression wrap  Dressing Status Clean;Dry;Intact  Wound 01/02/12 Abrasion(s) Leg Left;Anterior;Proximal most proximal tibial wound   Date First Assessed: 01/02/12   Wound Type: Abrasion(s)  Location: Leg  Location Orientation: Left;Anterior;Proximal  Wound Description (Comments): most proximal tibial wound   Site / Wound Assessment Granulation tissue  % Wound base Red or Granulating 100%  Drainage Amount Scant  Drainage Description Serous  Dressing Type Hydrogel;Moist to moist;Compression wrap;Gauze (Comment)  Dressing Status Clean;Dry;Intact  Wound 01/02/12 Abrasion(s) Leg Left;Mid;Anterior middle tibial wound  Date First Assessed: 01/02/12   Wound Type: Abrasion(s)  Location: Leg  Location Orientation: Left;Mid;Anterior  Wound Description (Comments): middle tibial  wound  Site / Wound Assessment Granulation tissue  % Wound base Red or Granulating 100%  Drainage Amount None  Wound 01/02/12 Abrasion(s) Leg Left;Distal;Anterior lower tibial wound  Date First Assessed: 01/02/12   Wound Type: Abrasion(s)  Location: Leg  Location Orientation: Left;Distal;Anterior  Wound Description (Comments): lower tibial wound  Site / Wound Assessment Granulation tissue  % Wound base Red or Granulating 100%  Drainage Amount None  Selective Debridement  Selective Debridement - Location to L lower leg anterior proximal wound and posterior   Selective Debridement - Tools Used Forceps;Scalpel  Selective Debridement - Tissue Removed Slough, dry skin  Wound Therapy - Assess/Plan/Recommendations  Wound Therapy - Clinical Statement Distal anterior wound fully healed today, no wound care complete distal wound.  Noted decreased edema L LE with new ankle pump exercise completed by pt and compression dressings.  Decreased drainage on posterior wound, no need for CaAg changed to moist to moist with hydrogel  Wound Plan Continue with debridment and dressing changes. Continue with compression dressing as needed.     Selective Debridement Selective Debridement - Location: to L lower leg anterior proximal wound and posterior  Selective Debridement - Tools Used: Forceps;Scalpel Selective Debridement - Tissue Removed: Slough, dry skin   Physical Therapy Assessment and Plan Wound Therapy - Assess/Plan/Recommendations Wound Therapy - Clinical Statement: Distal anterior wound fully healed today, no wound care complete distal wound.  Noted decreased edema L LE with new ankle pump exercise completed by pt and compression dressings.  Decreased drainage on posterior wound, no need for CaAg changed to moist to moist with hydrogel Wound Plan: Continue with debridment and dressing changes. Continue with compression dressing as needed.      Goals    Problem List Patient Active Problem List    Diagnosis  . CERUMEN  IMPACTION  . ACUTE BRONCHITIS  . RHINOSINUSITIS, CHRONIC  . ALLERGIC RHINITIS  . GERD  . COUGH  . DIABETES MELLITUS, HX OF  . Leg wound, left  . Ulcer of lower limb, unspecified    GP    Juel Burrow 01/16/2012, 6:18 PM

## 2012-01-20 ENCOUNTER — Ambulatory Visit (HOSPITAL_COMMUNITY)
Admission: RE | Admit: 2012-01-20 | Discharge: 2012-01-20 | Disposition: A | Discharge status: Home / Self Care | Payer: Medicare Other | Source: Ambulatory Visit | Attending: Physical Therapy | Admitting: Physical Therapy

## 2012-01-20 NOTE — Progress Notes (Signed)
Physical Therapy - Wound Therapy  Treatment   Patient Details  Name: Garrett [name] MRN: [id number] Date of Birth: Aug 09, 1933  Today's Date: 01/20/2012 Time: 0865-7846 Time Calculation (min): 45 min  Visit#: 6  of 8   Re-eval: 02/02/12 Charge: selective debridement <20 cm Subjective Subjective Assessment Subjective: My leg hasnt bothered me since I started coming here.  Feeling good have been doing those ankle pumps and noticed swelling decreased.  Pain Assessment Pain Assessment Pain Assessment: No/denies pain  Wound Therapy   01/20/12 1824  Subjective Assessment  Subjective My leg hasnt bothered me since I started coming here.  Feeling good have been doing those ankle pumps and noticed swelling decreased.  Wound 01/02/12 Blister (Blood filled) Leg Left;Posterior;Distal Calf wound  Date First Assessed: 01/02/12   Wound Type: Blister (Blood filled)  Location: Leg  Location Orientation: Left;Posterior;Distal  Wound Description (Comments): Calf wound  Site / Wound Assessment Pale;Pink  % Wound base Red or Granulating 95%  % Wound base Yellow 5%  Margins Unattacted edges (unapproximated)  Drainage Amount Scant  Drainage Description Serosanguineous  Dressing Type Moist to moist;Hydrogel;Gauze (Comment);Compression wrap;Other (Comment) (xeroform)  Dressing Status Clean;Dry;Intact  Wound 01/02/12 Abrasion(s) Leg Left;Anterior;Proximal most proximal tibial wound   Date First Assessed: 01/02/12   Wound Type: Abrasion(s)  Location: Leg  Location Orientation: Left;Anterior;Proximal  Wound Description (Comments): most proximal tibial wound   Site / Wound Assessment Granulation tissue  % Wound base Red or Granulating 100%  Drainage Amount Scant  Drainage Description Serous  Dressing Type Hydrogel;Moist to moist;Compression wrap;Gauze (Comment) (xeroform)  Dressing Status Clean;Dry;Intact  Selective Debridement  Selective Debridement - Location to L lower leg anterior proximal wound  and posterior   Selective Debridement - Tools Used Forceps;Scalpel  Selective Debridement - Tissue Removed Slough, dry skin  Wound Therapy - Assess/Plan/Recommendations  Wound Therapy - Clinical Statement Dressing stuck to periwound posterior wound, added xeroform to proximal anterior and posterior wound to prevent peeling of healing skin next treatment.  Overall decreased drainage, decreased swelling and improving granulation.  Able to remove dry skin over posterior wound with ease and no c/o from pt.  Wound Plan Continue with debridment and dressing changes. Continue with compression dressing as needed.     Selective Debridement Selective Debridement - Location: to L lower leg anterior proximal wound and posterior  Selective Debridement - Tools Used: Forceps;Scalpel Selective Debridement - Tissue Removed: Slough, dry skin   Physical Therapy Assessment and Plan Wound Therapy - Assess/Plan/Recommendations Wound Therapy - Clinical Statement: Dressing stuck to periwound posterior wound, added xeroform to proximal anterior and posterior wound to prevent peeling of healing skin next treatment.  Overall decreased drainage, decreased swelling and improving granulation.  Able to remove dry skin over posterior wound with ease and no c/o from pt. Wound Plan: Continue with debridment and dressing changes. Continue with compression dressing as needed.      Goals    Problem List Patient Active Problem List  Diagnosis  . CERUMEN IMPACTION  . ACUTE BRONCHITIS  . RHINOSINUSITIS, CHRONIC  . ALLERGIC RHINITIS  . GERD  . COUGH  . DIABETES MELLITUS, HX OF  . Leg wound, left  . Ulcer of lower limb, unspecified    GP    Juel Burrow 01/20/2012, 6:32 PM

## 2012-01-23 ENCOUNTER — Ambulatory Visit (HOSPITAL_COMMUNITY): Payer: Medicare Other | Admitting: Physical Therapy

## 2012-01-27 ENCOUNTER — Ambulatory Visit (HOSPITAL_COMMUNITY): Payer: Medicare Other

## 2012-01-30 ENCOUNTER — Ambulatory Visit (HOSPITAL_COMMUNITY)
Admission: RE | Admit: 2012-01-30 | Discharge: 2012-01-30 | Disposition: A | Discharge status: Home / Self Care | Payer: Medicare Other | Source: Ambulatory Visit | Attending: Physical Therapy | Admitting: Physical Therapy

## 2012-01-30 NOTE — Progress Notes (Signed)
Physical Therapy - Wound Therapy  Treatment   Patient Details  Name: Garrett [name] MRN: [id number] Date of Birth: 13-Sep-1933  Today's Date: 01/30/2012 Time: 1725-1810 Time Calculation (min): 45 min  Visit#: 7  of 8   Re-eval: 02/02/12 Selective debridement < 20 cm  Subjective Subjective Assessment Subjective: Leg feels good, no pain.  Dressing slid down exposing anterior proximal wound   Pain Assessment Pain Assessment Pain Assessment: No/denies pain  Wound Therapy   01/30/12 1804  Subjective Assessment  Subjective Leg feels good, no pain.  Dressing slid down exposing anterior proximal wound   Wound 01/02/12 Blister (Blood filled) Leg Left;Posterior;Distal Calf wound  Date First Assessed: 01/02/12   Wound Type: Blister (Blood filled)  Location: Leg  Location Orientation: Left;Posterior;Distal  Wound Description (Comments): Calf wound  Site / Wound Assessment Granulation tissue (fully healed)  % Wound base Red or Granulating 100%  % Wound base Yellow 0%  Margins Attached edges (approximated)  Drainage Amount None  Treatment Cleansed  Dressing Type Impregnated gauze (petrolatum);Compression wrap  Dressing Status Clean;Dry;Intact  Wound 01/02/12 Abrasion(s) Leg Left;Anterior;Proximal most proximal tibial wound   Date First Assessed: 01/02/12   Wound Type: Abrasion(s)  Location: Leg  Location Orientation: Left;Anterior;Proximal  Wound Description (Comments): most proximal tibial wound   Site / Wound Assessment Granulation tissue  % Wound base Red or Granulating 95%  % Wound base Yellow 5%  Drainage Amount Scant  Drainage Description Serous  Treatment Cleansed;Debridement (Selective)  Dressing Type Moist to moist;Hydrogel;Gauze (Comment);Compression wrap;Impregnated gauze (petrolatum)  Dressing Status Clean;Dry;Intact  Wound 01/02/12 Abrasion(s) Leg Left;Mid;Anterior middle tibial wound  Date First Assessed: 01/02/12   Wound Type: Abrasion(s)  Location: Leg  Location  Orientation: Left;Mid;Anterior  Wound Description (Comments): middle tibial wound  Site / Wound Assessment Granulation tissue (fully healed)  Wound 01/02/12 Abrasion(s) Leg Left;Distal;Anterior lower tibial wound  Date First Assessed: 01/02/12   Wound Type: Abrasion(s)  Location: Leg  Location Orientation: Left;Distal;Anterior  Wound Description (Comments): lower tibial wound  Site / Wound Assessment Granulation tissue (fully healed)  % Wound base Red or Granulating 100%  Selective Debridement  Selective Debridement - Location L anterior proximal wound  Selective Debridement - Tools Used Forceps  Selective Debridement - Tissue Removed Slough, dry skin  Wound Therapy - Assess/Plan/Recommendations  Wound Therapy - Clinical Statement Posterior wound fully healed.  Only remaining wound is the anterior proximal wound.  Whole leg very dry skin.  Cleansed left leg with soap and hot water to remove most of dry skin.  Debridement complete with forceps with removal of yellow slough.  Overall leg decreased edema, continued with compression dressings.  Wound Plan Remeasure next session.  Continue with debridment and dressing changes. Continue with compression dressing as needed.     Selective Debridement Selective Debridement - Location: L anterior proximal wound Selective Debridement - Tools Used: Forceps Selective Debridement - Tissue Removed: Slough, dry skin   Physical Therapy Assessment and Plan Wound Therapy - Assess/Plan/Recommendations Wound Therapy - Clinical Statement: Posterior wound fully healed.  Only remaining wound is the anterior proximal wound.  Whole leg very dry skin.  Cleansed left leg with soap and hot water to remove most of dry skin.  Debridement complete with forceps with removal of yellow slough.  Overall leg decreased edema, continued with compression dressings. Wound Plan: Remeasure next session.  Continue with debridment and dressing changes. Continue with compression  dressing as needed.      Goals    Problem List Patient Active  Problem List  Diagnosis  . CERUMEN IMPACTION  . ACUTE BRONCHITIS  . RHINOSINUSITIS, CHRONIC  . ALLERGIC RHINITIS  . GERD  . COUGH  . DIABETES MELLITUS, HX OF  . Leg wound, left  . Ulcer of lower limb, unspecified    GP    Juel Burrow 01/30/2012, 6:15 PM

## 2012-02-03 ENCOUNTER — Ambulatory Visit (HOSPITAL_COMMUNITY)
Admission: RE | Admit: 2012-02-03 | Discharge: 2012-02-03 | Disposition: A | Discharge status: Home / Self Care | Payer: Medicare Other | Source: Ambulatory Visit | Attending: Internal Medicine | Admitting: Internal Medicine

## 2012-02-03 NOTE — Progress Notes (Signed)
Physical Therapy - Wound Therapy - G-code update  Re-Evaluation   Patient Details  Name: Garrett [name] MRN: [id number] Date of Birth: September 14, 1933  Today's Date: 02/03/2012 Time: 4098-1191 Time Calculation (min): 30 min Charges: 1 deb < 20 cm Self Care x10 min.  Visit#: 8  of 16   Re-eval: 03/04/12  Subjective Subjective Assessment Subjective: He reports he does not know why Garrett bandage continues to fall.   Pain Assessment Pain Assessment Pain Assessment: No/denies pain  Wound Therapy Wound 01/02/12 Abrasion(s) Leg Left;Mid;Anterior middle tibial wound (Active)  Site / Wound Assessment Granulation tissue 01/30/2012  6:04 PM  % Wound base Red or Granulating 95% 02/03/2012  9:18 AM  % Wound base Yellow 5% 02/03/2012  9:18 AM  Peri-wound Assessment Erythema (blanchable) 01/13/2012  6:46 PM  Wound Length (cm) 3.1 cm 02/03/2012  9:18 AM  Wound Width (cm) 1 cm 02/03/2012  9:18 AM  Wound Depth (cm) 0 cm 02/03/2012  9:18 AM  Margins Attached edges (approximated) 01/13/2012  6:46 PM  Drainage Amount Minimal 02/03/2012  9:18 AM  Drainage Description Serous 02/03/2012  9:18 AM  Treatment Cleansed;Debridement (Selective) 02/03/2012  9:18 AM  Dressing Type Compression wrap;Honey;Hydrocolloid 02/03/2012  9:18 AM  Dressing Changed New 01/13/2012  6:37 PM  Dressing Status Clean;Dry;Intact 01/13/2012  6:46 PM     Wound 02/03/12 Abrasion(s) Leg Left;Anterior;Mid;Medial The middle would that has healed, broke off into two smaller wounds.  More medial wound (Active)  Site / Wound Assessment Bleeding;Clean 02/03/2012  9:18 AM  % Wound base Red or Granulating 95% 02/03/2012  9:18 AM  % Wound base Yellow 5% 02/03/2012  9:18 AM  Wound Length (cm) 1.3 cm 02/03/2012  9:18 AM  Wound Width (cm) 0.9 cm 02/03/2012  9:18 AM  Wound Depth (cm) 0 cm 02/03/2012  9:18 AM  Drainage Amount Minimal 02/03/2012  9:18 AM  Treatment Cleansed;Debridement (Selective) 02/03/2012  9:18 AM  Dressing Type Compression  wrap;Honey;Hydrocolloid;Tape dressing 02/03/2012  9:18 AM  Dressing Changed Changed 02/03/2012  9:18 AM  Dressing Status Clean;Dry;Intact 02/03/2012  9:18 AM   Selective Debridement Selective Debridement - Location: L anterior middle wound Selective Debridement - Tools Used: Forceps Selective Debridement - Tissue Removed: Slough and dry skin  Discussed with Garrett [name] and Garrett [name] about importance of compression.  Discussed continued importance of mobility and tips for decreasing LE swelling.  Physical Therapy Assessment and Plan Wound Therapy - Assess/Plan/Recommendations Wound Therapy - Clinical Statement: Re-eval and g-code updated.  Garrett [name] has attended 8 OP Garrett [name] visits to address wound on L leg.  He has fully healed 3 of 4 of Garrett wounds. Middle wound has become two smaller wounds.  He responds well to compression and has been given exercises to decrease overall swelling.  Recommend continuing with Garrett [name] until wound is fully healed.  Added honey hydrocolloid today to remaining wound.  RECOMMEND COMPRESSION STOKINGS of at least 30 mmHG.  Also recommend discussing with MD continued Garrett [name] for generalized weakness to improve independence and gait.  Wound Therapy - Frequency:  (2x/week, 4 weeks) Wound Plan: Continue with wound care. F/U on honey hydrocolliod dressing.       Goals Wound Therapy Goals - Improve the function of patient's integumentary system by progressing the wound(s) through the phases of wound healing by: Decrease Necrotic Tissue to: 0% Decrease Necrotic Tissue - Progress: Progressing toward goal Increase Granulation Tissue to: 100% Increase Granulation Tissue - Progress: Progressing toward goal Decrease Length/Width/Depth by (cm): Calf Wound: 2x3x0.  Anterior  Wonds: Proximal: 0x0x0, Mid: 1x1, Distal: 0x0x0 Decrease Length/Width/Depth - Progress: Partly met (3 of 4 healed, anterior mid still remains) Improve Drainage Characteristics: Min Improve Drainage Characteristics - Progress:  Met Patient/Family will be able to : Garrett [name] unable to as he lives by himself and has limited ROM  Problem List Patient Active Problem List  Diagnosis  . CERUMEN IMPACTION  . ACUTE BRONCHITIS  . RHINOSINUSITIS, CHRONIC  . ALLERGIC RHINITIS  . GERD  . COUGH  . DIABETES MELLITUS, HX OF  . Leg wound, left  . Ulcer of lower limb, unspecified    GP Functional Limitation: Other Garrett [name] primary Other Garrett [name] Primary Current Status (Z6109): At least 1 percent but less than 20 percent impaired, limited or restricted Other Garrett [name] Primary Goal Status (U0454): 0 percent impaired, limited or restricted  Garrett [name] 02/03/2012, 9:39 AM

## 2012-02-05 ENCOUNTER — Ambulatory Visit (HOSPITAL_COMMUNITY): Payer: Medicare Other

## 2012-02-06 ENCOUNTER — Ambulatory Visit (HOSPITAL_COMMUNITY): Payer: Medicare Other

## 2012-02-10 ENCOUNTER — Ambulatory Visit (HOSPITAL_COMMUNITY): Payer: Medicare Other

## 2012-02-11 ENCOUNTER — Ambulatory Visit (HOSPITAL_COMMUNITY): Payer: Medicare Other | Admitting: Physical Therapy

## 2012-02-16 ENCOUNTER — Ambulatory Visit (HOSPITAL_COMMUNITY): Payer: Medicare Other | Admitting: Physical Therapy

## 2012-02-17 ENCOUNTER — Ambulatory Visit (HOSPITAL_COMMUNITY)
Admission: RE | Admit: 2012-02-17 | Discharge: 2012-02-17 | Disposition: A | Discharge status: Home / Self Care | Payer: Medicare Other | Source: Ambulatory Visit | Attending: Internal Medicine | Admitting: Internal Medicine

## 2012-02-17 DIAGNOSIS — IMO0001 Reserved for inherently not codable concepts without codable children: Secondary | ICD-10-CM | POA: Insufficient documentation

## 2012-02-17 DIAGNOSIS — L97809 Non-pressure chronic ulcer of other part of unspecified lower leg with unspecified severity: Secondary | ICD-10-CM | POA: Insufficient documentation

## 2012-02-17 NOTE — Progress Notes (Signed)
Physical Therapy - Wound Therapy  Treatment   Patient Details  Name: Garrett [name] MRN: [id number] Date of Birth: 01-21-1934  Today's Date: 02/17/2012 Time: 8469-6295 Time Calculation (min): 35 min Charges: Selective debridement (= or < 20 cm)   Visit#: 9  of 16   Re-eval: 03/04/12  Subjective Subjective Assessment Subjective: Pt states that he took his bandage off yesterday because it had slid down.  Pain Assessment Pain Assessment Pain Assessment: No/denies pain  Wound Therapy Wound 01/02/12 Abrasion(s) Leg Left;Mid;Anterior middle tibial wound (Active)  Site / Wound Assessment Granulation tissue 01/30/2012  6:04 PM  % Wound base Red or Granulating 95% 02/03/2012  9:18 AM  % Wound base Yellow 5% 02/03/2012  9:18 AM  Peri-wound Assessment Erythema (blanchable) 01/13/2012  6:46 PM  Wound Length (cm) 3.1 cm 02/03/2012  9:18 AM  Wound Width (cm) 1 cm 02/03/2012  9:18 AM  Wound Depth (cm) 0 cm 02/03/2012  9:18 AM  Margins Attached edges (approximated) 01/13/2012  6:46 PM  Drainage Amount Minimal 02/03/2012  9:18 AM  Drainage Description Serous 02/03/2012  9:18 AM  Treatment Cleansed;Debridement (Selective) 02/03/2012  9:18 AM  Dressing Type Compression wrap;Honey;Hydrocolloid 02/03/2012  9:18 AM  Dressing Changed New 01/13/2012  6:37 PM  Dressing Status Clean;Dry;Intact 01/13/2012  6:46 PM     Wound 02/03/12 Abrasion(s) Leg Left;Anterior;Mid;Medial The middle would that has healed, broke off into two smaller wounds.  More medial wound (Active)  Site / Wound Assessment Bleeding;Clean 02/03/2012  9:18 AM  % Wound base Red or Granulating 95% 02/03/2012  9:18 AM  % Wound base Yellow 5% 02/03/2012  9:18 AM  Wound Length (cm) 1.3 cm 02/03/2012  9:18 AM  Wound Width (cm) 0.9 cm 02/03/2012  9:18 AM  Wound Depth (cm) 0 cm 02/03/2012  9:18 AM  Drainage Amount Minimal 02/03/2012  9:18 AM  Treatment Cleansed;Debridement (Selective) 02/03/2012  9:18 AM  Dressing Type Compression  wrap;Honey;Hydrocolloid;Tape dressing 02/03/2012  9:18 AM  Dressing Changed Changed 02/03/2012  9:18 AM  Dressing Status Clean;Dry;Intact 02/03/2012  9:18 AM   Selective Debridement Selective Debridement - Location: L distal tibia wound Selective Debridement - Tools Used: Forceps Selective Debridement - Tissue Removed: Slough and dry skin   New wounds on L mid calf listed proximal to distal .7x.8 .5x.7 .6x.7 .5x1.6  (No depth)  Physical Therapy Assessment and Plan Wound Therapy - Assess/Plan/Recommendations Wound Therapy - Clinical Statement: Wounds at L distal tibia are present again. Pt also presents with 4 new small wounds on posterior calf. New wounds were cleansed and dressed with honey hydrocolloid. Measurements listed above. Pt reports 0/10 pain at end of session.  Wound Plan: Continue wound care per PT POC.       Problem List Patient Active Problem List  Diagnosis  . CERUMEN IMPACTION  . ACUTE BRONCHITIS  . RHINOSINUSITIS, CHRONIC  . ALLERGIC RHINITIS  . GERD  . COUGH  . DIABETES MELLITUS, HX OF  . Leg wound, left  . Ulcer of lower limb, unspecified   Seth Bake, PTA 02/17/2012, 9:30 AM

## 2012-02-25 ENCOUNTER — Ambulatory Visit (HOSPITAL_COMMUNITY)
Admission: RE | Admit: 2012-02-25 | Discharge: 2012-02-25 | Disposition: A | Discharge status: Home / Self Care | Payer: Medicare Other | Source: Ambulatory Visit | Attending: Internal Medicine | Admitting: Internal Medicine

## 2012-02-25 NOTE — Progress Notes (Signed)
Physical Therapy - Wound Therapy  Treatment   Patient Details  Name: Garrett [name] MRN: [id number] Date of Birth: 1934/03/05  Today's Date: 02/25/2012 Time: 4098-1191 Time Calculation (min): 55 min  Visit#: 10  of 16   Re-eval: 03/04/12 Charges: Selective debridement (= or < 20 cm)   Subjective Subjective Assessment Subjective: Pt states that he went to MD this morning. MD is aware of wound on R leg and prescribed antibiotic.  Pain Assessment Pain Assessment Pain Assessment: No/denies pain  Wound Therapy Wound 01/02/12 Abrasion(s) Leg Left;Mid;Anterior middle tibial wound (Active)  Site / Wound Assessment Granulation tissue 01/30/2012  6:04 PM  % Wound base Red or Granulating 95% 02/03/2012  9:18 AM  % Wound base Yellow 5% 02/03/2012  9:18 AM  Peri-wound Assessment Erythema (blanchable) 01/13/2012  6:46 PM  Wound Length (cm) 3.1 cm 02/03/2012  9:18 AM  Wound Width (cm) 1 cm 02/03/2012  9:18 AM  Wound Depth (cm) 0 cm 02/03/2012  9:18 AM  Margins Attached edges (approximated) 01/13/2012  6:46 PM  Drainage Amount Minimal 02/03/2012  9:18 AM  Drainage Description Serous 02/03/2012  9:18 AM  Treatment Cleansed;Debridement (Selective) 02/03/2012  9:18 AM  Dressing Type Compression wrap;Honey;Hydrocolloid 02/03/2012  9:18 AM  Dressing Changed New 01/13/2012  6:37 PM  Dressing Status Clean;Dry;Intact 01/13/2012  6:46 PM     Wound 02/03/12 Abrasion(s) Leg Left;Anterior;Mid;Medial The middle would that has healed, broke off into two smaller wounds.  More medial wound (Active)  Site / Wound Assessment Bleeding;Clean 02/03/2012  9:18 AM  % Wound base Red or Granulating 95% 02/03/2012  9:18 AM  % Wound base Yellow 5% 02/03/2012  9:18 AM  Wound Length (cm) 1.3 cm 02/03/2012  9:18 AM  Wound Width (cm) 0.9 cm 02/03/2012  9:18 AM  Wound Depth (cm) 0 cm 02/03/2012  9:18 AM  Drainage Amount Minimal 02/03/2012  9:18 AM  Treatment Cleansed;Debridement (Selective) 02/03/2012  9:18 AM   Dressing Type Compression wrap;Honey;Hydrocolloid;Tape dressing 02/03/2012  9:18 AM  Dressing Changed Changed 02/03/2012  9:18 AM  Dressing Status Clean;Dry;Intact 02/03/2012  9:18 AM     Wound 02/25/12 Blister (Serous filled) Leg Right;Anterior;Mid (Active)  % Wound base Red or Granulating 90% 02/25/2012  1:08 PM  % Wound base Yellow 10% 02/25/2012  1:08 PM  Wound Length (cm) 7.5 cm 02/25/2012  1:08 PM  Wound Width (cm) 4.8 cm 02/25/2012  1:08 PM  Wound Depth (cm) 0 cm 02/25/2012  1:08 PM  Drainage Amount Minimal 02/25/2012  1:08 PM  Drainage Description Purulent;Odor 02/25/2012  1:08 PM  Treatment Cleansed;Debridement (Selective) 02/25/2012  1:08 PM  Dressing Type Honey;Hydrocolloid;Compression wrap 02/25/2012  1:08 PM  Dressing Changed New 02/25/2012  1:08 PM   Selective Debridement Selective Debridement - Location: L distal tibia wound Selective Debridement - Tools Used: Forceps Selective Debridement - Tissue Removed: Slough and dry skin   Physical Therapy Assessment and Plan Wound Therapy - Assess/Plan/Recommendations Wound Therapy - Clinical Statement: Wounds on L calf appeared to have decreased in size and are 100% granulated. These wounds were cleansed and honey was applied. Small wounds on lower L anterior leg are almost healed and 100% granulated. Honey was applied to these as well. New wound noted on R mid anterior leg. Wound is covered with dry slough. After debridement wound was 90% granulated. Wound was dressed with honey. Multiple small areas noted in medial calf. These areas were covered with honey and are anticipated to be healed by next session. BLE were wrapped with  kerlex,coban and size 5 netting. Wound Plan: Continue wound care per PT POC.      Problem List Patient Active Problem List  Diagnosis  . CERUMEN IMPACTION  . ACUTE BRONCHITIS  . RHINOSINUSITIS, CHRONIC  . ALLERGIC RHINITIS  . GERD  . COUGH  . DIABETES MELLITUS, HX OF  . Leg wound, left  . Ulcer  of lower limb, unspecified   Seth Bake, PTA 02/25/2012, 2:18 PM

## 2012-03-02 ENCOUNTER — Ambulatory Visit (HOSPITAL_COMMUNITY)
Admission: RE | Admit: 2012-03-02 | Discharge: 2012-03-02 | Disposition: A | Discharge status: Home / Self Care | Payer: Medicare Other | Source: Ambulatory Visit | Attending: Internal Medicine | Admitting: Internal Medicine

## 2012-03-02 NOTE — Progress Notes (Signed)
Physical Therapy - Wound Therapy  Treatment   Patient Details  Name: Garrett [name] MRN: [id number] Date of Birth: [birth year]  Today's Date: 03/02/2012 Time: 4098-1191 Time Calculation (min): 35 min  Visit#: 11  of 16   Re-eval: 03/04/12 Charges: Selective debridement (= or < 20 cm)   Subjective Subjective Assessment Subjective: Pt's family states that they are trying to get home health for the pt as it is difficult for them to take him to his appointments here.  Pain Assessment Pain Assessment Pain Assessment: No/denies pain  Wound Therapy Wound 01/02/12 Abrasion(s) Leg Left;Mid;Anterior middle tibial wound (Active)  Site / Wound Assessment Granulation tissue 01/30/2012  6:04 PM  % Wound base Red or Granulating 95% 02/03/2012  9:18 AM  % Wound base Yellow 5% 02/03/2012  9:18 AM  Peri-wound Assessment Erythema (blanchable) 01/13/2012  6:46 PM  Wound Length (cm) 3.1 cm 02/03/2012  9:18 AM  Wound Width (cm) 1 cm 02/03/2012  9:18 AM  Wound Depth (cm) 0 cm 02/03/2012  9:18 AM  Margins Attached edges (approximated) 01/13/2012  6:46 PM  Drainage Amount Minimal 02/03/2012  9:18 AM  Drainage Description Serous 02/03/2012  9:18 AM  Treatment Cleansed;Debridement (Selective) 02/03/2012  9:18 AM  Dressing Type Compression wrap;Honey;Hydrocolloid 02/03/2012  9:18 AM  Dressing Changed New 01/13/2012  6:37 PM  Dressing Status Clean;Dry;Intact 01/13/2012  6:46 PM     Wound 02/03/12 Abrasion(s) Leg Left;Anterior;Mid;Medial The middle would that has healed, broke off into two smaller wounds.  More medial wound (Active)  Site / Wound Assessment Bleeding;Clean 02/03/2012  9:18 AM  % Wound base Red or Granulating 95% 02/03/2012  9:18 AM  % Wound base Yellow 5% 02/03/2012  9:18 AM  Wound Length (cm) 1.3 cm 02/03/2012  9:18 AM  Wound Width (cm) 0.9 cm 02/03/2012  9:18 AM  Wound Depth (cm) 0 cm 02/03/2012  9:18 AM  Drainage Amount Minimal 02/03/2012  9:18 AM  Treatment Cleansed;Debridement  (Selective) 02/03/2012  9:18 AM  Dressing Type Compression wrap;Honey;Hydrocolloid;Tape dressing 02/03/2012  9:18 AM  Dressing Changed Changed 02/03/2012  9:18 AM  Dressing Status Clean;Dry;Intact 02/03/2012  9:18 AM     Wound 02/25/12 Blister (Serous filled) Leg Right;Anterior;Mid (Active)  % Wound base Red or Granulating 95% 03/02/2012 11:00 AM  % Wound base Yellow 5% 03/02/2012 11:00 AM  Wound Length (cm) 7.5 cm 02/25/2012  1:08 PM  Wound Width (cm) 4.8 cm 02/25/2012  1:08 PM  Wound Depth (cm) 0 cm 02/25/2012  1:08 PM  Drainage Amount Minimal 03/02/2012 11:00 AM  Drainage Description Purulent;Odor 03/02/2012 11:00 AM  Treatment Cleansed;Debridement (Selective) 03/02/2012 11:00 AM  Dressing Type Impregnated gauze (bismuth);Compression wrap 03/02/2012 11:00 AM  Dressing Changed New 03/02/2012 11:00 AM   Selective Debridement Selective Debridement - Location: R tibial wound Selective Debridement - Tools Used: Forceps Selective Debridement - Tissue Removed: Slough and dry skin   Physical Therapy Assessment and Plan Wound Therapy - Assess/Plan/Recommendations Wound Therapy - Clinical Statement: Abrasions on L tibia are much smaller and almost healed. The drainage from the R leg wound had slight odor but odor not present in wound after cleansing. Pt advised to contact MD about an order for compression garments as selling is the most likely cause for wounds. Pt is without complaint throughout session. Wound Plan: Reassess next session.  Problem List Patient Active Problem List  Diagnosis  . CERUMEN IMPACTION  . ACUTE BRONCHITIS  . RHINOSINUSITIS, CHRONIC  . ALLERGIC RHINITIS  . GERD  . COUGH  .  DIABETES MELLITUS, HX OF  . Leg wound, left  . Ulcer of lower limb, unspecified    Seth Bake, PTA 03/02/2012, 11:40 AM

## 2012-07-07 ENCOUNTER — Inpatient Hospital Stay (HOSPITAL_COMMUNITY)
Admission: EM | Admit: 2012-07-07 | Discharge: 2012-07-10 | DRG: 536 | Disposition: A | Discharge status: Skilled Nursing Facility | Payer: Medicare Other | Attending: Orthopedic Surgery | Admitting: Orthopedic Surgery

## 2012-07-07 ENCOUNTER — Emergency Department (HOSPITAL_COMMUNITY): Payer: Medicare Other

## 2012-07-07 ENCOUNTER — Encounter (HOSPITAL_COMMUNITY): Payer: Self-pay | Admitting: *Deleted

## 2012-07-07 DIAGNOSIS — S32401A Unspecified fracture of right acetabulum, initial encounter for closed fracture: Secondary | ICD-10-CM

## 2012-07-07 DIAGNOSIS — S32409A Unspecified fracture of unspecified acetabulum, initial encounter for closed fracture: Principal | ICD-10-CM | POA: Diagnosis present

## 2012-07-07 DIAGNOSIS — L03116 Cellulitis of left lower limb: Secondary | ICD-10-CM | POA: Diagnosis present

## 2012-07-07 DIAGNOSIS — Y92009 Unspecified place in unspecified non-institutional (private) residence as the place of occurrence of the external cause: Secondary | ICD-10-CM

## 2012-07-07 DIAGNOSIS — L02419 Cutaneous abscess of limb, unspecified: Secondary | ICD-10-CM | POA: Diagnosis present

## 2012-07-07 DIAGNOSIS — I712 Thoracic aortic aneurysm, without rupture, unspecified: Secondary | ICD-10-CM | POA: Diagnosis present

## 2012-07-07 DIAGNOSIS — Z794 Long term (current) use of insulin: Secondary | ICD-10-CM

## 2012-07-07 DIAGNOSIS — E7800 Pure hypercholesterolemia, unspecified: Secondary | ICD-10-CM | POA: Diagnosis present

## 2012-07-07 DIAGNOSIS — E1169 Type 2 diabetes mellitus with other specified complication: Secondary | ICD-10-CM | POA: Diagnosis present

## 2012-07-07 DIAGNOSIS — R9431 Abnormal electrocardiogram [ECG] [EKG]: Secondary | ICD-10-CM | POA: Diagnosis present

## 2012-07-07 DIAGNOSIS — I872 Venous insufficiency (chronic) (peripheral): Secondary | ICD-10-CM | POA: Diagnosis present

## 2012-07-07 DIAGNOSIS — I10 Essential (primary) hypertension: Secondary | ICD-10-CM | POA: Diagnosis present

## 2012-07-07 DIAGNOSIS — E119 Type 2 diabetes mellitus without complications: Secondary | ICD-10-CM

## 2012-07-07 DIAGNOSIS — L97909 Non-pressure chronic ulcer of unspecified part of unspecified lower leg with unspecified severity: Secondary | ICD-10-CM | POA: Diagnosis present

## 2012-07-07 DIAGNOSIS — W19XXXA Unspecified fall, initial encounter: Secondary | ICD-10-CM | POA: Diagnosis present

## 2012-07-07 DIAGNOSIS — D72829 Elevated white blood cell count, unspecified: Secondary | ICD-10-CM | POA: Diagnosis present

## 2012-07-07 HISTORY — DX: Thoracic aortic aneurysm, without rupture: I71.2

## 2012-07-07 HISTORY — DX: Type 2 diabetes mellitus without complications: E11.9

## 2012-07-07 HISTORY — DX: Pure hypercholesterolemia, unspecified: E78.00

## 2012-07-07 HISTORY — DX: Traumatic subdural hemorrhage with loss of consciousness of unspecified duration, initial encounter: S06.5X9A

## 2012-07-07 HISTORY — DX: Essential (primary) hypertension: I10

## 2012-07-07 HISTORY — DX: Thoracic aortic aneurysm, without rupture, unspecified: I71.20

## 2012-07-07 LAB — URINALYSIS, ROUTINE W REFLEX MICROSCOPIC
Hgb urine dipstick: NEGATIVE
Ketones, ur: NEGATIVE mg/dL
Protein, ur: 30 mg/dL — AB
Urobilinogen, UA: 1 mg/dL (ref 0.0–1.0)

## 2012-07-07 LAB — BASIC METABOLIC PANEL
Chloride: 103 mEq/L (ref 96–112)
GFR calc Af Amer: >90 mL/min (ref >90)
GFR calc non Af Amer: 86 mL/min — ABNORMAL LOW (ref >90)
Potassium: 3.3 mEq/L — ABNORMAL LOW (ref 3.5–5.1)
Sodium: 139 mEq/L (ref 135–145)

## 2012-07-07 LAB — CBC WITH DIFFERENTIAL/PLATELET
Basophils Absolute: 0 10*3/uL (ref 0.0–0.1)
HCT: 40.4 % (ref 39.0–52.0)
Hemoglobin: 14.1 g/dL (ref 13.0–17.0)
Lymphocytes Relative: 7 % — ABNORMAL LOW (ref 12–46)
Monocytes Absolute: 1.2 10*3/uL — ABNORMAL HIGH (ref 0.1–1.0)
Monocytes Relative: 8 % (ref 3–12)
Neutro Abs: 12.6 10*3/uL — ABNORMAL HIGH (ref 1.7–7.7)
Neutrophils Relative %: 85 % — ABNORMAL HIGH (ref 43–77)
RDW: 13.1 % (ref 11.5–15.5)
WBC: 14.9 10*3/uL — ABNORMAL HIGH (ref 4.0–10.5)

## 2012-07-07 LAB — URINE MICROSCOPIC-ADD ON

## 2012-07-07 LAB — GLUCOSE, CAPILLARY: Glucose-Capillary: 80 mg/dL (ref 70–99)

## 2012-07-07 LAB — PROTIME-INR
INR: 1.1 (ref 0.00–1.49)
Prothrombin Time: 14.1 seconds (ref 11.6–15.2)

## 2012-07-07 MED ORDER — POTASSIUM CHLORIDE CRYS ER 20 MEQ PO TBCR
40.0000 meq | EXTENDED_RELEASE_TABLET | Freq: Once | ORAL | Status: AC
  Administered 2012-07-08: 40 meq via ORAL
  Filled 2012-07-07: qty 2

## 2012-07-07 MED ORDER — ENOXAPARIN SODIUM 30 MG/0.3ML ~~LOC~~ SOLN
30.0000 mg | SUBCUTANEOUS | Status: DC
  Administered 2012-07-08: 30 mg via SUBCUTANEOUS
  Filled 2012-07-07: qty 0.3

## 2012-07-07 MED ORDER — LEVOFLOXACIN IN D5W 500 MG/100ML IV SOLN
INTRAVENOUS | Status: AC
  Filled 2012-07-07: qty 100

## 2012-07-07 MED ORDER — FENTANYL CITRATE 0.05 MG/ML IJ SOLN
25.0000 ug | INTRAMUSCULAR | Status: DC | PRN
  Administered 2012-07-07: 25 ug via INTRAVENOUS
  Filled 2012-07-07: qty 2

## 2012-07-07 MED ORDER — LEVOFLOXACIN IN D5W 500 MG/100ML IV SOLN
500.0000 mg | INTRAVENOUS | Status: DC
Start: 2012-07-07 — End: 2012-07-08
  Administered 2012-07-08: 500 mg via INTRAVENOUS
  Filled 2012-07-07: qty 100

## 2012-07-07 MED ORDER — INSULIN GLARGINE 100 UNIT/ML ~~LOC~~ SOLN: 62.0000 [IU] | Freq: Every day | SUBCUTANEOUS | Status: DC

## 2012-07-07 MED ORDER — HYDROCODONE-ACETAMINOPHEN 5-325 MG PO TABS
1.0000 | ORAL_TABLET | ORAL | Status: DC | PRN
  Administered 2012-07-08 – 2012-07-10 (×8): 1 via ORAL
  Filled 2012-07-07 (×8): qty 1

## 2012-07-07 MED ORDER — VANCOMYCIN HCL IN DEXTROSE 1-5 GM/200ML-% IV SOLN
INTRAVENOUS | Status: AC
  Filled 2012-07-07: qty 200

## 2012-07-07 MED ORDER — ATORVASTATIN CALCIUM 40 MG PO TABS
40.0000 mg | ORAL_TABLET | Freq: Every day | ORAL | Status: DC
  Administered 2012-07-08 – 2012-07-09 (×2): 40 mg via ORAL
  Filled 2012-07-07 (×3): qty 1

## 2012-07-07 MED ORDER — INSULIN ASPART 100 UNIT/ML ~~LOC~~ SOLN
0.0000 [IU] | Freq: Three times a day (TID) | SUBCUTANEOUS | Status: DC
  Administered 2012-07-08: 5 [IU] via SUBCUTANEOUS
  Administered 2012-07-08: 3 [IU] via SUBCUTANEOUS
  Administered 2012-07-08 – 2012-07-09 (×2): 5 [IU] via SUBCUTANEOUS
  Administered 2012-07-10: 3 [IU] via SUBCUTANEOUS

## 2012-07-07 MED ORDER — HYDROMORPHONE HCL PF 1 MG/ML IJ SOLN
0.5000 mg | INTRAMUSCULAR | Status: AC | PRN
  Administered 2012-07-08 (×2): 0.5 mg via INTRAVENOUS
  Filled 2012-07-07 (×2): qty 1

## 2012-07-07 MED ORDER — SODIUM CHLORIDE 0.9 % IV SOLN
INTRAVENOUS | Status: DC
  Administered 2012-07-07 – 2012-07-09 (×3): via INTRAVENOUS

## 2012-07-07 MED ORDER — FENTANYL CITRATE 0.05 MG/ML IJ SOLN
25.0000 ug | Freq: Once | INTRAMUSCULAR | Status: AC
  Administered 2012-07-07: 25 ug via NASAL
  Filled 2012-07-07: qty 2

## 2012-07-07 MED ORDER — AMLODIPINE BESYLATE 5 MG PO TABS
5.0000 mg | ORAL_TABLET | Freq: Every day | ORAL | Status: DC
  Administered 2012-07-08 – 2012-07-10 (×3): 5 mg via ORAL
  Filled 2012-07-07 (×3): qty 1

## 2012-07-07 MED ORDER — BENAZEPRIL HCL 10 MG PO TABS
20.0000 mg | ORAL_TABLET | Freq: Every day | ORAL | Status: DC
  Administered 2012-07-08 – 2012-07-10 (×3): 20 mg via ORAL
  Filled 2012-07-07 (×5): qty 2

## 2012-07-07 MED ORDER — INSULIN GLARGINE 100 UNIT/ML ~~LOC~~ SOLN
60.0000 [IU] | Freq: Every day | SUBCUTANEOUS | Status: DC
  Administered 2012-07-08 – 2012-07-10 (×3): 60 [IU] via SUBCUTANEOUS
  Filled 2012-07-07 (×5): qty 0.6

## 2012-07-07 MED ORDER — VANCOMYCIN HCL IN DEXTROSE 1-5 GM/200ML-% IV SOLN
1000.0000 mg | Freq: Two times a day (BID) | INTRAVENOUS | Status: DC
  Administered 2012-07-08 – 2012-07-10 (×5): 1000 mg via INTRAVENOUS
  Filled 2012-07-07 (×13): qty 200

## 2012-07-07 MED ORDER — METFORMIN HCL 500 MG PO TABS: 500.0000 mg | ORAL_TABLET | Freq: Two times a day (BID) | ORAL | Status: DC

## 2012-07-07 MED ORDER — ONDANSETRON HCL 4 MG/2ML IJ SOLN: 4.0000 mg | Freq: Three times a day (TID) | INTRAMUSCULAR | Status: AC | PRN

## 2012-07-07 MED ORDER — INSULIN ASPART 100 UNIT/ML ~~LOC~~ SOLN: 1.0000 [IU] | Freq: Three times a day (TID) | SUBCUTANEOUS | Status: DC

## 2012-07-07 MED ORDER — AMLODIPINE BESY-BENAZEPRIL HCL 5-20 MG PO CAPS: 1.0000 | ORAL_CAPSULE | Freq: Every day | ORAL | Status: DC

## 2012-07-07 MED ORDER — ONDANSETRON HCL 4 MG/2ML IJ SOLN: 4.0000 mg | Freq: Four times a day (QID) | INTRAMUSCULAR | Status: DC | PRN

## 2012-07-07 NOTE — ED Notes (Signed)
Arrives via EMS s/p fall; c/o right hip pain. States lost his balance which caused him to fall; per first responders, pt has SpO2 of 86% on RA - arrives on O2 6L/min per [place].

## 2012-07-07 NOTE — ED Provider Notes (Signed)
History     CSN: [id number]  Arrival date & time 07/07/12  1526   First MD Initiated Contact with Patient 07/07/12 1534      Chief Complaint  Patient presents with  . Fall  . Hip Pain     HPI Pt was seen at 1545.  Per pt, c/o sudden onset and persistence of constant right hip "pain" that began after he fell PTA.  Pt states he was walking with his [place] at home, slipped and fell, landing directly onto his right hip.  States he was unable to weightbear on his right hip due to pain.  Denies hitting head, no LOC, no syncope, no neck or back pain, no focal motor weakness, no tingling/numbness in extremities, no CP/SOB, no abd pain.    Past Medical History  Diagnosis Date  . Diabetes mellitus without complication   . Hypertension   . Hypercholesterolemia   . SDH (subdural hematoma) 05/2010    due to fall  . Thoracic aortic aneurysm without rupture     No past surgical history on file.    History  Substance Use Topics  . Smoking status: Never Smoker   . Smokeless tobacco: Not on file  . Alcohol Use: No      Review of Systems ROS: Statement: All systems negative except as marked or noted in the HPI; Constitutional: Negative for fever and chills. ; ; Eyes: Negative for eye pain, redness and discharge. ; ; ENMT: Negative for ear pain, hoarseness, nasal congestion, sinus pressure and sore throat. ; ; Cardiovascular: Negative for chest pain, palpitations, diaphoresis, dyspnea and peripheral edema. ; ; Respiratory: Negative for cough, wheezing and stridor. ; ; Gastrointestinal: Negative for nausea, vomiting, diarrhea, abdominal pain, blood in stool, hematemesis, jaundice and rectal bleeding. . ; ; Genitourinary: Negative for dysuria, flank pain and hematuria. ; ; Musculoskeletal: +right hip pain. Negative for back pain and neck pain. Negative for swelling and deformity.; ; Skin: Negative for pruritus, rash, abrasions, blisters, bruising and skin lesion.; ; Neuro: Negative for headache,  lightheadedness and neck stiffness. Negative for weakness, altered level of consciousness , altered mental status, extremity weakness, paresthesias, involuntary movement, seizure and syncope.        Allergies  Review of patient's allergies indicates no known allergies.  Home Medications   Current Outpatient Rx  Name  Route  Sig  Dispense  Refill  . amLODipine-benazepril (LOTREL) 5-20 MG per capsule   Oral   Take 1 capsule by mouth daily.         . insulin aspart (NOVOLOG) 100 UNIT/ML injection   Subcutaneous   Inject 1-10 Units into the skin 3 (three) times daily with meals.         . insulin glargine (LANTUS) 100 UNIT/ML injection   Subcutaneous   Inject 62-80 Units into the skin daily.          . metFORMIN (GLUCOPHAGE) 500 MG tablet   Oral   Take 500 mg by mouth 2 (two) times daily with a meal.         . rosuvastatin (CRESTOR) 20 MG tablet   Oral   Take 20 mg by mouth daily.           BP 154/69  Pulse 84  Temp(Src) 98.4 F (36.9 C) (Oral)  Resp 22  Ht 5\' 9"  (1.753 m)  Wt 180 lb (81.647 kg)  BMI 26.57 kg/m2  SpO2 97%  Physical Exam 1550: Physical examination:  Nursing notes reviewed; Vital signs  and O2 SAT reviewed;  Constitutional: Well developed, Well nourished, Well hydrated, Uncomfortable appearing.; Head:  Normocephalic, atraumatic; Eyes: EOMI, PERRL, No scleral icterus; ENMT: Mouth and pharynx normal, Mucous membranes moist; Neck: Supple, Full range of motion, No lymphadenopathy; Cardiovascular: Regular rate and rhythm, No gallop; Respiratory: Breath sounds clear & equal bilaterally, No rales, rhonchi, wheezes.  Speaking full sentences with ease, Normal respiratory effort/excursion; Chest: Nontender, Movement normal; Abdomen: Soft, Nontender, Nondistended, Normal bowel sounds; Genitourinary: No CVA tenderness; Spine:  No midline CS, TS, LS tenderness.; Extremities: Pulses normal, +right anterior hip tenderness to palp. No deformity, Pelvis stable. NT  right knee/ankle/foot. +1 pedal edema bilat with chronic stasis changes, erythema and several shallow ulcers L>R.; Neuro: AA&Ox3, Major CN grossly intact.  Speech clear. No gross focal motor or sensory deficits in extremities.; Skin: Color normal, Warm, Dry.   ED Course  Procedures     MDM  MDM Reviewed: previous chart, nursing note and vitals Reviewed previous: labs and ECG Interpretation: labs, ECG, x-ray and CT scan    Date: 07/07/2012  Rate: 76  Rhythm: normal sinus rhythm and premature ventricular contractions (PVC)  QRS Axis: left  Intervals: normal  ST/T Wave abnormalities: normal  Conduction Disutrbances:right bundle branch block and left anterior fascicular block  Narrative Interpretation:   Old EKG Reviewed: changes noted; RBBB new since previous EKG dated 05/26/2010.  Results for orders placed during the hospital encounter of 07/07/12  GLUCOSE, CAPILLARY      Result Value Range   Glucose-Capillary 80  70 - 99 mg/dL  PROTIME-INR      Result Value Range   Prothrombin Time 14.1  11.6 - 15.2 seconds   INR 1.10  0.00 - 1.49  APTT      Result Value Range   aPTT 31  24 - 37 seconds  BASIC METABOLIC PANEL      Result Value Range   Sodium 139  135 - 145 mEq/L   Potassium 3.3 (*) 3.5 - 5.1 mEq/L   Chloride 103  96 - 112 mEq/L   CO2 27  19 - 32 mEq/L   Glucose, Bld 64 (*) 70 - 99 mg/dL   BUN 28 (*) 6 - 23 mg/dL   Creatinine, Ser 1.61  0.50 - 1.35 mg/dL   Calcium 9.6  8.4 - 09.6 mg/dL   GFR calc non Af Amer 86 (*) >90 mL/min   GFR calc Af Amer >90  >90 mL/min  CBC WITH DIFFERENTIAL      Result Value Range   WBC 14.9 (*) 4.0 - 10.5 K/uL   RBC 4.62  4.22 - 5.81 MIL/uL   Hemoglobin 14.1  13.0 - 17.0 g/dL   HCT 04.5  40.9 - 81.1 %   MCV 87.4  78.0 - 100.0 fL   MCH 30.5  26.0 - 34.0 pg   MCHC 34.9  30.0 - 36.0 g/dL   RDW 91.4  78.2 - 95.6 %   Platelets 209  150 - 400 K/uL   Neutrophils Relative 85 (*) 43 - 77 %   Neutro Abs 12.6 (*) 1.7 - 7.7 K/uL   Lymphocytes  Relative 7 (*) 12 - 46 %   Lymphs Abs 1.1  0.7 - 4.0 K/uL   Monocytes Relative 8  3 - 12 %   Monocytes Absolute 1.2 (*) 0.1 - 1.0 K/uL   Eosinophils Relative 0  0 - 5 %   Eosinophils Absolute 0.0  0.0 - 0.7 K/uL   Basophils Relative 0  0 -  1 %   Basophils Absolute 0.0  0.0 - 0.1 K/uL  TROPONIN I      Result Value Range   Troponin I <0.30  <0.30 ng/mL   Dg Hip Complete Right 07/07/2012  *RADIOLOGY REPORT*  Clinical Data: Right hip and groin pain post fall  RIGHT HIP - COMPLETE 2+ VIEW  Comparison: None  Findings: Diffuse osseous demineralization. Symmetric hip and SI joints. Bony pelvis appears intact. On the frog-leg lateral view, a focal contour abnormality is identified along the femoral neck near the junction with the femoral head. Subtle femoral neck fracture not excluded. No definite evidence of fracture is seen on the AP view or coming pelvic radiograph.  IMPRESSION: Cannot exclude subtle fracture at right femoral neck; CT right hip without contrast recommended to exclude right femoral neck fracture.   Original Report Authenticated By: Ulyses Southward, M.D.    Ct Hip Right Wo Contrast 07/07/2012  *RADIOLOGY REPORT*  Clinical Data: Right hip pain secondary to a fall.  Indeterminate x- rays.  CT OF THE RIGHT HIP WITHOUT CONTRAST  Technique:  Multidetector CT imaging was performed according to the standard protocol. Multiplanar CT image reconstructions were also generated.  Comparison:  Radiographs dated 07/07/2012  Findings: There is a subtle  comminuted fracture of the superior aspect of the acetabulum extending into the medial aspect of the acetabulum anteriorly without displacement.  Right femoral head and neck are intact.  The intertrochanteric region is normal.  The inferior and superior pubic rami are intact.  Slight osteophytes on the acetabulum.  IMPRESSION: Comminuted but nondisplaced fracture of the superior and anterior medial aspects of the right acetabulum.   Original Report Authenticated  By: Francene Boyers, M.D.    Dg Chest Port 1 View 07/07/2012  *RADIOLOGY REPORT*  Clinical Data: 79 year old male with hip fracture.  Fall.  PORTABLE CHEST - 1 VIEW  Comparison: 06/10/2010 earlier.  Findings: Portable semi upright AP view 1940 hours.  Improved lung volumes.  Stable cardiac size and mediastinal contours.  No pneumothorax, pulmonary edema, pleural effusion or consolidation.  IMPRESSION: Improved lung volumes. No acute cardiopulmonary abnormality.   Original Report Authenticated By: Erskine Speed, M.D.     [phone]:  Dx and testing d/w pt and family.  Questions answered.  Verb understanding, agreeable to admit.  T/C to Ortho Dr. Romeo Apple, case discussed, including:  HPI, pertinent PM/SHx, VS/PE, dx testing, ED course and treatment:  Agreeable to admit, not a surgical issue, pt will need rehab/placement, requests to have [facility] consult.  T/C to [facility]. Rito Ehrlich, case discussed, including:  HPI, pertinent PM/SHx, VS/PE, dx testing, ED course and treatment:  Agreeable to consult.              Laray Anger, DO 07/08/12 1736

## 2012-07-07 NOTE — H&P (Signed)
[facility] Medical Consultation  Garrett [name] OZH:[id number] DOB: Jun 29, 1933 DOA: 07/07/2012 Garrett [name] is an 79 y.o. male.    PCP: Dwana Melena, MD     Requesting physician: Dr. Romeo Apple, with orthopedics Date of consultation: 07/07/2012 Reason for consultation: Management of diabetes, and lower extremity cellulitis  Chief Complaint: Fall  HPI: Garrett [name] is a 79 year old, Caucasian male, with a past medical history of hypertension, diabetes, hypercholesterolemia, who uses a [place] to ambulate and who got up from his chair, got unsteady, turned in the wrong direction and then felt as if the right leg was giving out and then he fell. Denies any syncopal episodes or dizziness. Denies any head injuries. He got himself up back to the chair. However, his right leg started hurting. He called his son-in-law. He couldn't bear weight on the right side and so, they called 911. Currently, the pain is 4 of 10 in intensity. Denies any chest pain or shortness of breath. He, said he fell about 2 years ago, and required placement into a rehabilitation facility. He also has a wound in his left lower extremity for which he was seen at the wound Center. He has had antibiotics earlier this year for the same. However, he feels, that his legs are more red than normal. Denies any fever or chills recently. No nausea, vomiting. Orthopedics is admitting the patient, although they don't plan any surgical intervention. They requested medical consultation.   Home Medications: Prior to Admission medications   Medication Sig Start Date End Date Taking? Authorizing Provider  amLODipine-benazepril (LOTREL) 5-20 MG per capsule Take 1 capsule by mouth daily.   Yes Historical Provider, MD  insulin aspart (NOVOLOG) 100 UNIT/ML injection Inject 1-10 Units into the skin 3 (three) times daily with meals.   Yes Historical Provider, MD  insulin glargine (LANTUS) 100 UNIT/ML injection Inject 62-80 Units into the skin daily.     Yes Historical Provider, MD  metFORMIN (GLUCOPHAGE) 500 MG tablet Take 500 mg by mouth 2 (two) times daily with a meal.   Yes Historical Provider, MD  rosuvastatin (CRESTOR) 20 MG tablet Take 20 mg by mouth daily.   Yes Historical Provider, MD    Allergies: No Known Allergies  Past Medical History: Past Medical History  Diagnosis Date  . Diabetes mellitus without complication   . Hypertension   . Hypercholesterolemia   . SDH (subdural hematoma) 05/2010    due to fall  . Thoracic aortic aneurysm without rupture     Past Surgical History  Procedure Laterality Date  . Inguinal hernia repair Bilateral   . Hernia repair      Social History:   reports that he has never smoked. He does not have any smokeless tobacco history on file. He reports that he does not drink alcohol or use illicit drugs.  Living Situation: Lives by himself Activity Level: Uses a [place] to emulate   Family History: Denies any medical problems in the family  Review of Systems - History obtained from the patient General ROS: positive for  - fatigue Psychological ROS: negative Ophthalmic ROS: negative ENT ROS: negative Allergy and Immunology ROS: negative Hematological and Lymphatic ROS: negative Endocrine ROS: negative Respiratory ROS: no cough, shortness of breath, or wheezing Cardiovascular ROS: no chest pain or dyspnea on exertion Gastrointestinal ROS: no abdominal pain, change in bowel habits, or black or bloody stools Genito-Urinary ROS: no dysuria, trouble voiding, or hematuria Musculoskeletal ROS: as in hpi Neurological ROS: no TIA or stroke symptoms Dermatological ROS:  negative  Physical Examination: Filed Vitals:   07/07/12 1536 07/07/12 1934  BP: 154/69 146/77  Pulse: 84 76  Temp: 98.4 F (36.9 C) 98.8 F (37.1 C)  TempSrc: Oral Oral  Resp: 22 18  Height: 5\' 9"  (1.753 m)   Weight: 81.647 kg (180 lb)   SpO2: 97% 100%    General appearance: alert, cooperative, appears stated age and  no distress Head: Normocephalic, without obvious abnormality, atraumatic Eyes: conjunctivae/corneas clear. PERRL, EOM's intact. Neck: no adenopathy, no carotid bruit, no JVD, supple, symmetrical, trachea midline and thyroid not enlarged, symmetric, no tenderness/mass/nodules Resp: clear to auscultation bilaterally Cardio: regular rate and rhythm, S1, S2 normal, no murmur, click, rub or gallop GI: soft, non-tender; bowel sounds normal; no masses,  no organomegaly Extremities: He has erythema bilateral lower extremities, left greater than right. He has shallow ulcers on the left leg. Large blister in the posterior aspect of the left leg. No calf tenderness is present. Pulses are poorly palpable Pulses: Poorly palpable in LE Skin: Erythema in the lower extremities as mentioned above Lymph nodes: Cervical, supraclavicular, and axillary nodes normal. Neurologic: He is alert and oriented x3. No focal neurological deficits are present  Laboratory Data: Results for orders placed during the hospital encounter of 07/07/12 (from the past 48 hour(s))  GLUCOSE, CAPILLARY     Status: None   Collection Time    07/07/12  3:40 PM      Result Value Range   Glucose-Capillary 80  70 - 99 mg/dL  PROTIME-INR     Status: None   Collection Time    07/07/12  7:25 PM      Result Value Range   Prothrombin Time 14.1  11.6 - 15.2 seconds   INR 1.10  0.00 - 1.49  APTT     Status: None   Collection Time    07/07/12  7:25 PM      Result Value Range   aPTT 31  24 - 37 seconds  BASIC METABOLIC PANEL     Status: Abnormal   Collection Time    07/07/12  7:25 PM      Result Value Range   Sodium 139  135 - 145 mEq/L   Potassium 3.3 (*) 3.5 - 5.1 mEq/L   Chloride 103  96 - 112 mEq/L   CO2 27  19 - 32 mEq/L   Glucose, Bld 64 (*) 70 - 99 mg/dL   BUN 28 (*) 6 - 23 mg/dL   Creatinine, Ser 1.61  0.50 - 1.35 mg/dL   Calcium 9.6  8.4 - 09.6 mg/dL   GFR calc non Af Amer 86 (*) >90 mL/min   GFR calc Af Amer >90  >90  mL/min   Comment:            The eGFR has been calculated     using the CKD EPI equation.     This calculation has not been     validated in all clinical     situations.     eGFR's persistently     <90 mL/min signify     possible Chronic Kidney Disease.  CBC WITH DIFFERENTIAL     Status: Abnormal   Collection Time    07/07/12  7:25 PM      Result Value Range   WBC 14.9 (*) 4.0 - 10.5 K/uL   RBC 4.62  4.22 - 5.81 MIL/uL   Hemoglobin 14.1  13.0 - 17.0 g/dL   HCT 04.5  40.9 - 81.1 %  MCV 87.4  78.0 - 100.0 fL   MCH 30.5  26.0 - 34.0 pg   MCHC 34.9  30.0 - 36.0 g/dL   RDW 40.9  81.1 - 91.4 %   Platelets 209  150 - 400 K/uL   Neutrophils Relative 85 (*) 43 - 77 %   Neutro Abs 12.6 (*) 1.7 - 7.7 K/uL   Lymphocytes Relative 7 (*) 12 - 46 %   Lymphs Abs 1.1  0.7 - 4.0 K/uL   Monocytes Relative 8  3 - 12 %   Monocytes Absolute 1.2 (*) 0.1 - 1.0 K/uL   Eosinophils Relative 0  0 - 5 %   Eosinophils Absolute 0.0  0.0 - 0.7 K/uL   Basophils Relative 0  0 - 1 %   Basophils Absolute 0.0  0.0 - 0.1 K/uL  TROPONIN I     Status: None   Collection Time    07/07/12  7:25 PM      Result Value Range   Troponin I <0.30  <0.30 ng/mL   Comment:            Due to the release kinetics of cTnI,     a negative result within the first hours     of the onset of symptoms does not rule out     myocardial infarction with certainty.     If myocardial infarction is still suspected,     repeat the test at appropriate intervals.    Imaging Studies: Dg Hip Complete Right  07/07/2012  *RADIOLOGY REPORT*  Clinical Data: Right hip and groin pain post fall  RIGHT HIP - COMPLETE 2+ VIEW  Comparison: None  Findings: Diffuse osseous demineralization. Symmetric hip and SI joints. Bony pelvis appears intact. On the frog-leg lateral view, a focal contour abnormality is identified along the femoral neck near the junction with the femoral head. Subtle femoral neck fracture not excluded. No definite evidence of  fracture is seen on the AP view or coming pelvic radiograph.  IMPRESSION: Cannot exclude subtle fracture at right femoral neck; CT right hip without contrast recommended to exclude right femoral neck fracture.   Original Report Authenticated By: Ulyses Southward, M.D.    Ct Hip Right Wo Contrast  07/07/2012  *RADIOLOGY REPORT*  Clinical Data: Right hip pain secondary to a fall.  Indeterminate x- rays.  CT OF THE RIGHT HIP WITHOUT CONTRAST  Technique:  Multidetector CT imaging was performed according to the standard protocol. Multiplanar CT image reconstructions were also generated.  Comparison:  Radiographs dated 07/07/2012  Findings: There is a subtle  comminuted fracture of the superior aspect of the acetabulum extending into the medial aspect of the acetabulum anteriorly without displacement.  Right femoral head and neck are intact.  The intertrochanteric region is normal.  The inferior and superior pubic rami are intact.  Slight osteophytes on the acetabulum.  IMPRESSION: Comminuted but nondisplaced fracture of the superior and anterior medial aspects of the right acetabulum.   Original Report Authenticated By: Francene Boyers, M.D.    Dg Chest Port 1 View  07/07/2012  *RADIOLOGY REPORT*  Clinical Data: 79 year old male with hip fracture.  Fall.  PORTABLE CHEST - 1 VIEW  Comparison: 06/10/2010 earlier.  Findings: Portable semi upright AP view 1940 hours.  Improved lung volumes.  Stable cardiac size and mediastinal contours.  No pneumothorax, pulmonary edema, pleural effusion or consolidation.  IMPRESSION: Improved lung volumes. No acute cardiopulmonary abnormality.   Original Report Authenticated By: Erskine Speed, M.D.  EKG: EKG shows sinus rhythm at 76 beats a minute. PVC is seen. He has right bundle-branch block, left anterior fascicular block. Unfortunately, no older EKGs are available at this time.  Impression/Recommendations  Active Problems:   Ulcer of lower limb, unspecified   Right acetabular  fracture   DM type 2 (diabetes mellitus, type 2)   Cellulitis of left lower extremity   #1 cellulitis of his lower extremities, left greater than right. He is a blister in the posterior aspect of his left leg. He has some shallow ulcers with yellowish exudate anteriorly. He has erythema. He will be placed on vancomycin and Levaquin for now. Wound care consultation will be obtained. CBCs will be followed.  #2 diabetes mellitus, type II: He has hypoglycemia. CBGs will be followed up on. He be placed on a sliding scale without any nighttime coverage. He'll be started on a lower dose of Lantus from tomorrow. HbA1c will be checked. Metformin will be held for now.  #3 right acetabular fracture: Management per orthopedics. If he does require surgery he will require preoperative evaluation.  #4 abnormal EKG: We will repeat EKG in the morning. He denies any chest pain whatsoever. If he does require surgery for his fracture he would require cardiac workup. This can be done as an outpatient. However, if he requires surgical intervention this will need to be pursued as an inpatient. We can consider getting an ECHo to begin with.  #5 history of hypertension: Continue his antihypertensive regimen.  He would like to thank Dr. Romeo Apple for this consultation. We will continue to follow him on a daily basis.   Further management decisions will depend on results of further testing and patient's response to treatment.  [facility]  [facility] Pager [phone]  If 7PM-7AM, please contact night-coverage.  [email or web address] Password [facility]  07/07/2012, 10:17 PM

## 2012-07-07 NOTE — Progress Notes (Signed)
ANTIBIOTIC CONSULT NOTE - INITIAL  Pharmacy Consult for Vancomycin Indication: cellulitis  No Known Allergies  Patient Measurements: Height: 5\' 9"  (175.3 cm) Weight:  (bed has no scale and pt has fracture) IBW/kg (Calculated) : 70.7  Vital Signs: Temp: 98 F (36.7 C) (04/23 2250) Temp src: Oral (04/23 2250) BP: 169/58 mmHg (04/23 2250) Pulse Rate: 71 (04/23 2250) Intake/Output from previous day:   Intake/Output from this shift:    Labs:  Recent Labs  07/07/12 1925  WBC 14.9*  HGB 14.1  PLT 209  CREATININE 0.76   Estimated Creatinine Clearance: 78.6 ml/min (by C-G formula based on Cr of 0.76). No results found for this basename: VANCOTROUGH, VANCOPEAK, VANCORANDOM, GENTTROUGH, GENTPEAK, GENTRANDOM, TOBRATROUGH, TOBRAPEAK, TOBRARND, AMIKACINPEAK, AMIKACINTROU, AMIKACIN,  in the last 72 hours   Microbiology: No results found for this or any previous visit (from the past 720 hour(s)).  Medical History: Past Medical History  Diagnosis Date  . Diabetes mellitus without complication   . Hypertension   . Hypercholesterolemia   . SDH (subdural hematoma) 05/2010    due to fall  . Thoracic aortic aneurysm without rupture     Medications:  Scheduled:  . [START ON 07/08/2012] amLODipine  5 mg Oral Daily   And  . [START ON 07/08/2012] benazepril  20 mg Oral Daily  . [START ON 07/08/2012] atorvastatin  40 mg Oral q1800  . [START ON 07/08/2012] enoxaparin (LOVENOX) injection  30 mg Subcutaneous Q24H  . [COMPLETED] fentaNYL  25 mcg Nasal Once  . [START ON 07/08/2012] insulin aspart  0-15 Units Subcutaneous TID WC  . [START ON 07/08/2012] insulin glargine  60 Units Subcutaneous Daily  . levofloxacin (LEVAQUIN) IV  500 mg Intravenous Q24H  . potassium chloride  40 mEq Oral Once  . [DISCONTINUED] amLODipine-benazepril  1 capsule Oral Daily  . [DISCONTINUED] insulin aspart  1-10 Units Subcutaneous TID WC  . [DISCONTINUED] insulin glargine  62-80 Units Subcutaneous Daily  .  [DISCONTINUED] metFORMIN  500 mg Oral BID WC   Assessment: 79 yo M admitted with BLE cellulitis to start on Vancomycin & Levaquin.  This appears to be a chronic condition for which he is seen by the wound clinic & has been previously treated with antibiotics that has recently worsened.   Renal function is at patient's baseline.  He is currently afebrile with slightly elevated WBC.      Goal of Therapy:  Vancomycin trough level 10-15 mcg/ml  Plan:  Vancomycin 1gm IV Q12h Check Vancomycin trough at steady state Monitor renal function and cx data   Elson Clan 07/07/2012,10:57 PM

## 2012-07-07 NOTE — H&P (Signed)
Garrett [name] is an 79 y.o. male.   Chief Complaint: right groin pain  HPI: 78 fell at home today, he was brought to the hospital because he had trouble ambulating. He has a history of a left lower extremity ulcer which was treated in our physical therapy Department under the direction of Dr. Shelva Majestic  He currently has a cellulitis on the right lower cavity as well. Yesterday he complained of severe nonradiating sharp and sometimes dull aching right groin pain.  This morning he can flex his hip his pain is better with oral and IV pain medication  His potassium was 3.3 yesterday and today is 4.0  Medical consult has been obtained and he has been started on vancomycin and Levaquin and wound care consult has been obtained. He had an abnormal EKG and medicine as ordered a 2-D echo  He will not require surgery is not a good surgical candidate and the fracture does not want surgical repair  Review of systems the patient reports no major problems at this time.  No current facility-administered medications on file prior to encounter.   No current outpatient prescriptions on file prior to encounter.   Current facility-administered medications:0.9 %  sodium chloride infusion, , Intravenous, Continuous, Laray Anger, DO, Last Rate: 75 mL/hr at 07/07/12 1945;  amLODipine (NORVASC) tablet 5 mg, 5 mg, Oral, Daily, Vickki Hearing, MD;  atorvastatin (LIPITOR) tablet 40 mg, 40 mg, Oral, q1800, Vickki Hearing, MD;  benazepril (LOTENSIN) tablet 20 mg, 20 mg, Oral, Daily, Vickki Hearing, MD enoxaparin (LOVENOX) injection 30 mg, 30 mg, Subcutaneous, Q24H, Vickki Hearing, MD;  HYDROcodone-acetaminophen (NORCO/VICODIN) 5-325 MG per tablet 1 tablet, 1 tablet, Oral, Q4H PRN, Vickki Hearing, MD, 1 tablet at 07/08/12 0005;  HYDROmorphone (DILAUDID) injection 0.5 mg, 0.5 mg, Intravenous, Q4H PRN, Laray Anger, DO, 0.5 mg at 07/08/12 6962 insulin aspart (novoLOG) injection 0-15 Units,  0-15 Units, Subcutaneous, TID WC, Osvaldo Shipper, MD;  insulin glargine (LANTUS) injection 60 Units, 60 Units, Subcutaneous, Daily, Osvaldo Shipper, MD;  levofloxacin (LEVAQUIN) IVPB 500 mg, 500 mg, Intravenous, Q24H, Osvaldo Shipper, MD, 500 mg at 07/08/12 0006;  ondansetron [facility]) injection 4 mg, 4 mg, Intravenous, Q8H PRN, Laray Anger, DO ondansetron [facility]) injection 4 mg, 4 mg, Intravenous, Q6H PRN, Vickki Hearing, MD;  vancomycin (VANCOCIN) IVPB 1000 mg/200 mL premix, 1,000 mg, Intravenous, Q12H, [facility], RPH, 1,000 mg at 07/08/12 0202 Past Medical History  Diagnosis Date  . Diabetes mellitus without complication   . Hypertension   . Hypercholesterolemia   . SDH (subdural hematoma) 05/2010    due to fall  . Thoracic aortic aneurysm without rupture     History reviewed. No pertinent past surgical history.  History reviewed. No pertinent family history. Social History:  reports that he has never smoked. He does not have any smokeless tobacco history on file. He reports that he does not drink alcohol or use illicit drugs.  Allergies: No Known Allergies  Physical Exam  Vital signs: BP 155/60  Pulse 76  Temp(Src) 99.3 F (37.4 C) (Oral)  Resp 18  Ht 5\' 9"  (1.753 m)  Wt 180 lb (81.647 kg)  BMI 26.57 kg/m2  SpO2 91%  GENERAL: normal development , medium frame  CDV: pulses are normal in each foot  Skin: normal in the upper extremities and right lower extremity there is a cellulitis on the left lower extremity  Lymph: nodes were not palpable/normal  Psychiatric: awake, alert and  oriented  Neuro: normal sensation  MSK  Gait: Not ambulatory at present secondary to pain in the right hip  Upper extremity exam  The right and left upper extremity:   Inspection and palpation revealed no abnormalities in the upper extremities.   Range of motion is full without contracture.  Motor exam is normal with grade 5 strength.  The joints are fully  reduced without subluxation.  There is no atrophy or tremor and muscle tone is normal.  All joints are stable.   Left Lower extremity exam  Inspection and palpation revealed no tenderness or abnormality in alignment in the lower extremities. Range of motion is full.  Strength is grade 5.  All joints are stable.  Right hip flexion active 90. Difficult straight leg raise. No tenderness. Painful range of motion with flexion internal and external rotation. Muscle tone normal. Hips stable. Alignment normal     (Not in a hospital admission)  Results for orders placed during the hospital encounter of 07/07/12 (from the past 48 hour(s))  GLUCOSE, CAPILLARY     Status: None   Collection Time    07/07/12  3:40 PM      Result Value Range   Glucose-Capillary 80  70 - 99 mg/dL  PROTIME-INR     Status: None   Collection Time    07/07/12  7:25 PM      Result Value Range   Prothrombin Time 14.1  11.6 - 15.2 seconds   INR 1.10  0.00 - 1.49  APTT     Status: None   Collection Time    07/07/12  7:25 PM      Result Value Range   aPTT 31  24 - 37 seconds  BASIC METABOLIC PANEL     Status: Abnormal   Collection Time    07/07/12  7:25 PM      Result Value Range   Sodium 139  135 - 145 mEq/L   Potassium 3.3 (*) 3.5 - 5.1 mEq/L   Chloride 103  96 - 112 mEq/L   CO2 27  19 - 32 mEq/L   Glucose, Bld 64 (*) 70 - 99 mg/dL   BUN 28 (*) 6 - 23 mg/dL   Creatinine, Ser 9.60  0.50 - 1.35 mg/dL   Calcium 9.6  8.4 - 45.4 mg/dL   GFR calc non Af Amer 86 (*) >90 mL/min   GFR calc Af Amer >90  >90 mL/min   Comment:            The eGFR has been calculated     using the CKD EPI equation.     This calculation has not been     validated in all clinical     situations.     eGFR's persistently     <90 mL/min signify     possible Chronic Kidney Disease.  CBC WITH DIFFERENTIAL     Status: Abnormal   Collection Time    07/07/12  7:25 PM      Result Value Range   WBC 14.9 (*) 4.0 - 10.5 K/uL   RBC  4.62  4.22 - 5.81 MIL/uL   Hemoglobin 14.1  13.0 - 17.0 g/dL   HCT 09.8  11.9 - 14.7 %   MCV 87.4  78.0 - 100.0 fL   MCH 30.5  26.0 - 34.0 pg   MCHC 34.9  30.0 - 36.0 g/dL   RDW 82.9  56.2 - 13.0 %   Platelets 209  150 - 400  K/uL   Neutrophils Relative 85 (*) 43 - 77 %   Neutro Abs 12.6 (*) 1.7 - 7.7 K/uL   Lymphocytes Relative 7 (*) 12 - 46 %   Lymphs Abs 1.1  0.7 - 4.0 K/uL   Monocytes Relative 8  3 - 12 %   Monocytes Absolute 1.2 (*) 0.1 - 1.0 K/uL   Eosinophils Relative 0  0 - 5 %   Eosinophils Absolute 0.0  0.0 - 0.7 K/uL   Basophils Relative 0  0 - 1 %   Basophils Absolute 0.0  0.0 - 0.1 K/uL  TROPONIN I     Status: None   Collection Time    07/07/12  7:25 PM      Result Value Range   Troponin I <0.30  <0.30 ng/mL   Comment:            Due to the release kinetics of cTnI,     a negative result within the first hours     of the onset of symptoms does not rule out     myocardial infarction with certainty.     If myocardial infarction is still suspected,     repeat the test at appropriate intervals.   Dg Hip Complete Right  07/07/2012  *RADIOLOGY REPORT*  Clinical Data: Right hip and groin pain post fall  RIGHT HIP - COMPLETE 2+ VIEW  Comparison: None  Findings: Diffuse osseous demineralization. Symmetric hip and SI joints. Bony pelvis appears intact. On the frog-leg lateral view, a focal contour abnormality is identified along the femoral neck near the junction with the femoral head. Subtle femoral neck fracture not excluded. No definite evidence of fracture is seen on the AP view or coming pelvic radiograph.  IMPRESSION: Cannot exclude subtle fracture at right femoral neck; CT right hip without contrast recommended to exclude right femoral neck fracture.   Original Report Authenticated By: Ulyses Southward, M.D.    Ct Hip Right Wo Contrast  07/07/2012  *RADIOLOGY REPORT*  Clinical Data: Right hip pain secondary to a fall.  Indeterminate x- rays.  CT OF THE RIGHT HIP WITHOUT CONTRAST   Technique:  Multidetector CT imaging was performed according to the standard protocol. Multiplanar CT image reconstructions were also generated.  Comparison:  Radiographs dated 07/07/2012  Findings: There is a subtle  comminuted fracture of the superior aspect of the acetabulum extending into the medial aspect of the acetabulum anteriorly without displacement.  Right femoral head and neck are intact.  The intertrochanteric region is normal.  The inferior and superior pubic rami are intact.  Slight osteophytes on the acetabulum.  IMPRESSION: Comminuted but nondisplaced fracture of the superior and anterior medial aspects of the right acetabulum.   Original Report Authenticated By: Francene Boyers, M.D.    Dg Chest Port 1 View  07/07/2012  *RADIOLOGY REPORT*  Clinical Data: 79 year old male with hip fracture.  Fall.  PORTABLE CHEST - 1 VIEW  Comparison: 06/10/2010 earlier.  Findings: Portable semi upright AP view 1940 hours.  Improved lung volumes.  Stable cardiac size and mediastinal contours.  No pneumothorax, pulmonary edema, pleural effusion or consolidation.  IMPRESSION: Improved lung volumes. No acute cardiopulmonary abnormality.   Original Report Authenticated By: Erskine Speed, M.D.     ROS  Blood pressure 146/77, pulse 76, temperature 98.8 F (37.1 C), temperature source Oral, resp. rate 18, height 5\' 9"  (1.753 m), weight 180 lb (81.647 kg), SpO2 100.00%. Physical Exam   Assessment/Plan Chart and xray review  NO surgery  NWB x 6 weeks  Med consult to help manage medical issues   Fuller Canada 07/07/2012, 9:37 PM

## 2012-07-08 DIAGNOSIS — S32409A Unspecified fracture of unspecified acetabulum, initial encounter for closed fracture: Principal | ICD-10-CM

## 2012-07-08 DIAGNOSIS — I517 Cardiomegaly: Secondary | ICD-10-CM

## 2012-07-08 LAB — HEMOGLOBIN A1C: Hgb A1c MFr Bld: 10 % — ABNORMAL HIGH (ref <5.7)

## 2012-07-08 LAB — GLUCOSE, CAPILLARY
Glucose-Capillary: 170 mg/dL — ABNORMAL HIGH (ref 70–99)
Glucose-Capillary: 208 mg/dL — ABNORMAL HIGH (ref 70–99)

## 2012-07-08 LAB — COMPREHENSIVE METABOLIC PANEL
BUN: 24 mg/dL — ABNORMAL HIGH (ref 6–23)
CO2: 26 mEq/L (ref 19–32)
Calcium: 8.8 mg/dL (ref 8.4–10.5)
Creatinine, Ser: 0.91 mg/dL (ref 0.50–1.35)
GFR calc Af Amer: >90 mL/min (ref >90)
GFR calc non Af Amer: 80 mL/min — ABNORMAL LOW (ref >90)
Glucose, Bld: 209 mg/dL — ABNORMAL HIGH (ref 70–99)
Total Protein: 6.2 g/dL (ref 6.0–8.3)

## 2012-07-08 LAB — CBC
Hemoglobin: 13.3 g/dL (ref 13.0–17.0)
MCH: 30.6 pg (ref 26.0–34.0)
MCHC: 34.6 g/dL (ref 30.0–36.0)
MCV: 88.3 fL (ref 78.0–100.0)
RBC: 4.35 MIL/uL (ref 4.22–5.81)

## 2012-07-08 MED ORDER — ENOXAPARIN SODIUM 40 MG/0.4ML ~~LOC~~ SOLN
40.0000 mg | SUBCUTANEOUS | Status: DC
Start: 2012-07-09 — End: 2012-07-10
  Administered 2012-07-09: 40 mg via SUBCUTANEOUS
  Filled 2012-07-08: qty 0.4

## 2012-07-08 MED ORDER — LEVOFLOXACIN IN D5W 750 MG/150ML IV SOLN
750.0000 mg | INTRAVENOUS | Status: DC
  Administered 2012-07-08 – 2012-07-09 (×2): 750 mg via INTRAVENOUS
  Filled 2012-07-08 (×4): qty 150

## 2012-07-08 NOTE — Care Management Note (Unsigned)
    Page 1 of 1   07/08/2012     2:59:22 PM   CARE MANAGEMENT NOTE 07/08/2012  Patient:  Garrett [name], Garrett [name]   Account Number:  [id number]  Date Initiated:  07/08/2012  Documentation initiated by:  Sharrie Rothman  Subjective/Objective Assessment:   Pt admitted from home with cellulits and hip fracture. Pt lives alone but has a very supportive family who are very active in the care of the pt. Pt is needing SNF for rehab and is agreeable.     Action/Plan:   CSW to arrange discharge to facility when medically stable.   Anticipated DC Date:  07/10/2012   Anticipated DC Plan:  SKILLED NURSING FACILITY  In-house referral  Clinical Social Worker      DC Planning Services  CM consult      Choice offered to / List presented to:             Status of service:  In process, will continue to follow Medicare Important Message given?   (If response is "NO", the following Medicare IM given date fields will be blank) Date Medicare IM given:   Date Additional Medicare IM given:    Discharge Disposition:    Per UR Regulation:    If discussed at Long Length of Stay Meetings, dates discussed:    Comments:  07/08/12 1500 Arlyss Queen, RN BSN CM

## 2012-07-08 NOTE — Consult Note (Addendum)
WOC consult Note Reason for Consult: Consult requested for leg edema and wounds.  Performed via remote camera with assistance from bedside nurse.  Pt states he is followed by the outpatient wound care center for chronic leg wounds prior to admission and legs became more red and swollen and painful this past week with blisters. Wound type:  stasis ulcers Measurement: Right leg with generalized erythremia and edema Right calf partial thickness stasis ulcer generalized erythremia and edema .5X3X.1cm, dark red purple ruptured blister, small yellow drainage, no odor. Left leg upper calf with partial thickness stasis ulcer .5X.5X.1cm, dry yellow wound bed, small yellow drainage, no odor Left calf partial thickness stasis ulcer 1X1X.1cm, dry yellow wound bed, small yellow drainage, no odor Left posterior leg with partial thickness stasis ulcer, 4X5X.1cm, ruptured blister with purple wound bed, small yellow drainage and peeling skin. Plan:  Pt on IV antibiotics for cellulitis. Foam dressings to absorb drainage and promote healing. Please re-consult if further assistance is needed.  Thank-you,  Cammie Mcgee MSN, RN, CWOCN, [place], CNS [phone]

## 2012-07-08 NOTE — Evaluation (Addendum)
Physical Therapy Evaluation Patient Details Name: Garrett [name] MRN: [id number] DOB: 09-May-1933 Today's Date: 07/08/2012 Time: 0800-0825 PT Time Calculation (min): 25 min  PT Assessment / Plan / Recommendation Clinical Impression  Pt is a 79 year old male admitted to [facility] for Rt hip fracture and will recieve conservative treatments and is referred to PT for following impairments below.  At this time pt is learning a new skill with RLE NWB and has significant difficulty finding his COG and has a posterior lean with standing activities.  At this time due to his independent living without definte help at home as well as need for mod A for most mobility would recommend SNF. Using teach back pt able to independently verablize NWB status.     PT Assessment  Patient needs continued PT services    Follow Up Recommendations  SNF    Does the patient have the potential to tolerate intense rehabilitation      Barriers to Discharge Decreased caregiver support      Equipment Recommendations  Other (comment) (refer to next venue of care)    Recommendations for Other Services     Frequency 7X/week    Precautions / Restrictions Restrictions Weight Bearing Restrictions: Yes RLE Weight Bearing: Non weight bearing   Pertinent Vitals/Pain FACES: 8/10 during mobility.  2/10 during rest      Mobility  Bed Mobility Bed Mobility: Supine to Sit;Sit to Supine;Scooting to [facility];Sitting - Scoot to Edge of Bed Supine to Sit: 5: Supervision Sitting - Scoot to Edge of Bed: 4: Min assist;With rail Sit to Supine: 4: Min assist;HOB flat;With rail Scooting to [facility]: 3: Mod assist;With rail Details for Bed Mobility Assistance: cueing for handplacement on the bed.  Transfers Transfers: Sit to Stand;Stand to Sit Sit to Stand: 3: Mod assist;From elevated surface;From bed;With upper extremity assist Stand to Sit: 3: Mod assist;With upper extremity assist;To bed;To elevated surface Details for Transfer Assistance:  Repeated 3 times for techniquem cueing for technique Ambulation/Gait Ambulation/Gait Assistance: Not tested (comment)    Exercises     PT Diagnosis: Difficulty walking;Generalized weakness  PT Problem List: Decreased strength;Decreased activity tolerance;Decreased balance;Decreased mobility;Decreased safety awareness PT Treatment Interventions: Gait training;Stair training;Functional mobility training;Therapeutic activities;Therapeutic exercise;Balance training;Patient/family education   PT Goals Acute Rehab PT Goals PT Goal Formulation: With patient Time For Goal Achievement: 07/15/12 Potential to Achieve Goals: Fair Pt will go Supine/Side to Sit: with modified independence;with supervision;with HOB 0 degrees;with rail PT Goal: Supine/Side to Sit - Progress: Goal set today Pt will go Sit to Stand: with min assist;from elevated surface;with upper extremity assist PT Goal: Sit to Stand - Progress: Goal set today Pt will Ambulate: 16 - 50 feet;with min assist;with rolling Vater PT Goal: Ambulate - Progress: Goal set today Pt will Go Up / Down Stairs: 1-2 stairs;with min assist;with rolling Tedder PT Goal: Up/Down Stairs - Progress: Goal set today (2 inch step)  Visit Information  Last PT Received On: 07/08/12    Subjective Data  Subjective: I hope do not have to go to a nursing facility.  My family works all different hours    Prior Functioning  Home Living Lives With: Alone Available Help at Discharge: Family Type of Home: House Home Access: Stairs to enter Secretary/administrator of Steps: 1 Home Layout: One level Home Adaptive Equipment: Wheelchair - manual;Other (comment);Inda - rolling (transport chair) Additional Comments: typically takes a sponge bath.  Prior Function Level of Independence: Independent with assistive device(s) Able to Take Stairs?: Yes Driving:  No Vocation: Retired Musician: Clinical cytogeneticist       Extremity/Trunk Assessment  Right Lower Extremity Assessment RLE ROM/Strength/Tone: Unable to fully assess;Due to pain Left Lower Extremity Assessment LLE ROM/Strength/Tone: Within functional levels   Balance Balance Balance Assessed: Yes Static Standing Balance Static Standing - Comment/# of Minutes: posterior lean during standing secondary to following NWB percautions  End of Session PT - End of Session Equipment Utilized During Treatment: Gait belt Activity Tolerance: Patient limited by pain Patient left: in bed;with call bell/phone within reach;with bed alarm set Nurse Communication: Mobility status  GP     Isaid Salvia 07/08/2012, 11:13 AM

## 2012-07-08 NOTE — Clinical Social Work Placement (Signed)
Clinical Social Work Department CLINICAL SOCIAL WORK PLACEMENT NOTE 07/08/2012  Patient:  Garrett [name], Garrett [name]  Account Number:  [id number] Admit date:  07/07/2012  Clinical Social Worker:  Derenda Fennel, LCSW  Date/time:  07/08/2012 12:07 PM  Clinical Social Work is seeking post-discharge placement for this patient at the following level of care:   SKILLED NURSING   (*CSW will update this form in Epic as items are completed)   07/08/2012  Patient/family provided with [facility] Department of Clinical Social Work's list of facilities offering this level of care within the geographic area requested by the patient (or if unable, by the patient's family).  07/08/2012  Patient/family informed of their freedom to choose among providers that offer the needed level of care, that participate in Medicare, Medicaid or managed care program needed by the patient, have an available bed and are willing to accept the patient.  07/08/2012  Patient/family informed of [facility]' ownership interest in [facility], as well as of the fact that they are under no obligation to receive care at this facility.  PASARR submitted to EDS on  PASARR number received from EDS on   FL2 transmitted to all facilities in geographic area requested by pt/family on  07/08/2012 FL2 transmitted to all facilities within larger geographic area on   Patient informed that his/her managed care company has contracts with or will negotiate with  certain facilities, including the following:     Patient/family informed of bed offers received:   Patient chooses bed at  Physician recommends and patient chooses bed at    Patient to be transferred to  on   Patient to be transferred to facility by   The following physician request were entered in Epic:   Additional Comments: existing pasarr number  Derenda Fennel, LCSW [phone]

## 2012-07-08 NOTE — Clinical Social Work Psychosocial (Signed)
Clinical Social Work Department BRIEF PSYCHOSOCIAL ASSESSMENT 07/08/2012  Patient:  Garrett [name], Garrett [name]     Account Number:  [id number]     Admit date:  07/07/2012  Clinical Social Worker:  Nancie Neas  Date/Time:  07/08/2012 12:10 PM  Referred by:  Physician  Date Referred:  07/08/2012 Referred for  SNF Placement   Other Referral:   Interview type:  Patient Other interview type:    PSYCHOSOCIAL DATA Living Status:  ALONE Admitted from facility:   Level of care:   Primary support name:  Garrett [name] Primary support relationship to patient:  CHILD, ADULT Degree of support available:   supportive family per pt    CURRENT CONCERNS Current Concerns  Post-Acute Placement   Other Concerns:    SOCIAL WORK ASSESSMENT / PLAN CSW met with pt at bedside following referral from MD/PT for SNF. Pt alert and oriented and reports he lives alone. He was trying to get up from his lift chair yesterday using his Friede and somehow fell. He was able to crawl back into his chair and called his son-in-law who came over. Pt began experiencing more pain at this point and EMS was called. Pt has a right acetabular fracture and surgery is not recommended. Pt generally uses a Swing at baseline and reports he is fairly independent. He has multiple family members who live nearby and are supportive. PT evaluated pt this morning and recommendation is for SNF. He reports he has been to [facility] in the past for several months after a fall and a coma. Aware of Medicare coverage/criteria. Agreeable to SNF, preferably in [place] if possible. SNF list provided.   Assessment/plan status:  Psychosocial Support/Ongoing Assessment of Needs Other assessment/ plan:   Information/referral to community resources:   SNF list    PATIENT'S/FAMILY'S RESPONSE TO PLAN OF CARE: Pt agreeable to [facility] SNF for rehab at d/c. CSW to continue to follow. Will return with bed offers when available.       Garrett [name], [place] [phone]

## 2012-07-08 NOTE — Progress Notes (Signed)
Physical Therapy Treatment Patient Details Name: Garrett [name] MRN: [id number] DOB: 31-Oct-1933 Today's Date: 07/08/2012 Time: 4010-2725 PT Time Calculation (min): 12 min  PT Assessment / Plan / Recommendation Comments on Treatment Session  Unable to perform full treatment due to pt. being transported to ICU for wound consult.  Pt. able to transfer NWB RT LE to chair, however c/o pain with movement.  Pt. aware and compliant with WB status.  Will instruct with therex next treatment.    Follow Up Recommendations  SNF     Does the patient have the potential to tolerate intense rehabilitation     Barriers to Discharge Decreased caregiver support      Equipment Recommendations   (refer to next venue of care)    Recommendations for Other Services    Frequency 7X/week   Plan Discharge plan remains appropriate    Precautions / Restrictions Precautions Precautions: Fall Restrictions Weight Bearing Restrictions: Yes RLE Weight Bearing: Non weight bearing   Pertinent Vitals/Pain     Mobility  Bed Mobility Bed Mobility: Supine to Sit;Sit to Supine;Scooting to [facility];Sitting - Scoot to Edge of Bed Supine to Sit: 5: Supervision Sitting - Scoot to Edge of Bed: 4: Min assist;With rail Sit to Supine: 4: Min assist;HOB flat;With rail Scooting to [facility]: 3: Mod assist;With rail Details for Bed Mobility Assistance: cueing for handplacement on the bed.  Transfers Transfers: Sit to Stand;Stand to Sit Sit to Stand: 3: Mod assist;From elevated surface;From bed;With upper extremity assist Stand to Sit: 3: Mod assist;With upper extremity assist;To bed;To elevated surface Details for Transfer Assistance: stand pivot to chair; able to maintain NWB to R LE. Ambulation/Gait Ambulation/Gait Assistance: Not tested (comment)    Exercises:  Unable to perform today due to time     PT Diagnosis: Difficulty walking;Generalized weakness  PT Problem List: Decreased strength;Decreased activity  tolerance;Decreased balance;Decreased mobility;Decreased safety awareness PT Treatment Interventions: Gait training;Stair training;Functional mobility training;Therapeutic activities;Therapeutic exercise;Balance training;Patient/family education   PT Goals Acute Rehab PT Goals PT Goal Formulation: With patient Time For Goal Achievement: 07/15/12 Potential to Achieve Goals: Fair Pt will go Supine/Side to Sit: with modified independence;with supervision;with HOB 0 degrees;with rail PT Goal: Supine/Side to Sit - Progress: Goal set today Pt will go Sit to Stand: with min assist;from elevated surface;with upper extremity assist PT Goal: Sit to Stand - Progress: Goal set today Pt will Ambulate: 16 - 50 feet;with min assist;with rolling Huizenga PT Goal: Ambulate - Progress: Goal set today Pt will Go Up / Down Stairs: 1-2 stairs;with min assist;with rolling Castro PT Goal: Up/Down Stairs - Progress: Goal set today (2 inch step)  Visit Information  Last PT Received On: 07/08/12    Subjective Data  Subjective: Pt. states he is ready to get out of the bed.  Reports his hip is hurting quite a bit, especially with movement.   Cognition  Cognition Arousal/Alertness: Awake/alert Behavior During Therapy: WFL for tasks assessed/performed Overall Cognitive Status: Within Functional Limits for tasks assessed    Balance  Balance Balance Assessed: Yes Static Standing Balance Static Standing - Comment/# of Minutes: posterior lean during standing secondary to following NWB percautions  End of Session PT - End of Session Equipment Utilized During Treatment: Gait belt Activity Tolerance: Patient limited by pain Patient left: in chair;Other (comment) (nursing transporting to ICU for wound consult) Nurse Communication: Mobility status   GP     Lurena Nida, PTA/CLT 07/08/2012, 11:48 AM

## 2012-07-08 NOTE — Progress Notes (Signed)
[facility] PROGRESS NOTE  Garrett [name]:[id number] DOB: Mar 07, 1934 DOA: 07/07/2012 PCP: Dwana Melena, MD  Assessment/Plan: 1. Cellulitis/venous stasis of lower strategies, left greater than right. Patient has been started on intravenous antibiotics. His leukocytosis is improving. Wound care recommendations are appreciated. He will [facility]. We'll need to keep legs elevated for now. Continue current antibiotics. 2. Diabetes. Continue to follow CBGs. He blood sugars are better today. Hemoglobin A1c is in process. 3. Right acetabular fracture. In his upper therapeutics. 4. Abnormal EKG. Patient does not have any chest pains. We'll follow up on echocardiogram. Repeat EKG 5. Hypertension. Controlled.  Family Communication: Discussed with patient Disposition Plan: Will likely need skilled nursing facility placement   Procedures:  None  Antibiotics:  Vancomycin 4/24  Levaquin 4/23  HPI/Subjective: Denies any chest pain. Pain is controlled in the lower extremities when he not moving.  Objective: Filed Vitals:   07/07/12 1934 07/07/12 2250 07/08/12 0408 07/08/12 1348  BP: 146/77 169/58 155/60 153/68  Pulse: 76 71 76 73  Temp: 98.8 F (37.1 C) 98 F (36.7 C) 99.3 F (37.4 C) 98.8 F (37.1 C)  TempSrc: Oral Oral Oral Oral  Resp: 18 18 18 18   Height:  5\' 9"  (1.753 m)    Weight:      SpO2: 100% 92% 91% 97%    Intake/Output Summary (Last 24 hours) at 07/08/12 1456 Last data filed at 07/08/12 0846  Gross per 24 hour  Intake    240 ml  Output    275 ml  Net    -35 ml   Filed Weights   07/07/12 1536  Weight: 81.647 kg (180 lb)    Exam:   General:  No acute distress   Cardiovascular:S1, S2, regular rate and rhythm  Respiratory: Clear to auscultation bilaterally  Abdomen: Soft, nontender, nondistended, bowel sounds are active  Musculoskeletal: Venous stasis changes noted in the lower extremities, trace pedal edema  bilaterally, erythema and warmth noted in bilateral lower extremities.   Data Reviewed: Basic Metabolic Panel:  Recent Labs Lab 07/07/12 1925 07/08/12 0554  NA 139 137  K 3.3* 4.0  CL 103 103  CO2 27 26  GLUCOSE 64* 209*  BUN 28* 24*  CREATININE 0.76 0.91  CALCIUM 9.6 8.8   Liver Function Tests:  Recent Labs Lab 07/08/12 0554  AST 20  ALT 19  ALKPHOS 66  BILITOT 0.6  PROT 6.2  ALBUMIN 2.9*   No results found for this basename: LIPASE, AMYLASE,  in the last 168 hours No results found for this basename: AMMONIA,  in the last 168 hours CBC:  Recent Labs Lab 07/07/12 1925 07/08/12 0554  WBC 14.9* 11.7*  NEUTROABS 12.6*  --   HGB 14.1 13.3  HCT 40.4 38.4*  MCV 87.4 88.3  PLT 209 181   Cardiac Enzymes:  Recent Labs Lab 07/07/12 1925  TROPONINI <0.30   BNP (last 3 results) No results found for this basename: PROBNP,  in the last 8760 hours CBG:  Recent Labs Lab 07/07/12 1540 07/08/12 0810 07/08/12 1123  GLUCAP 80 170* 225*    No results found for this or any previous visit (from the past 240 hour(s)).   Studies: Dg Hip Complete Right  07/07/2012  *RADIOLOGY REPORT*  Clinical Data: Right hip and groin pain post fall  RIGHT HIP - COMPLETE 2+ VIEW  Comparison: None  Findings: Diffuse osseous demineralization. Symmetric hip and SI joints. Bony pelvis appears intact. On the frog-leg  lateral view, a focal contour abnormality is identified along the femoral neck near the junction with the femoral head. Subtle femoral neck fracture not excluded. No definite evidence of fracture is seen on the AP view or coming pelvic radiograph.  IMPRESSION: Cannot exclude subtle fracture at right femoral neck; CT right hip without contrast recommended to exclude right femoral neck fracture.   Original Report Authenticated By: Ulyses Southward, M.D.    Ct Hip Right Wo Contrast  07/07/2012  *RADIOLOGY REPORT*  Clinical Data: Right hip pain secondary to a fall.  Indeterminate x- rays.   CT OF THE RIGHT HIP WITHOUT CONTRAST  Technique:  Multidetector CT imaging was performed according to the standard protocol. Multiplanar CT image reconstructions were also generated.  Comparison:  Radiographs dated 07/07/2012  Findings: There is a subtle  comminuted fracture of the superior aspect of the acetabulum extending into the medial aspect of the acetabulum anteriorly without displacement.  Right femoral head and neck are intact.  The intertrochanteric region is normal.  The inferior and superior pubic rami are intact.  Slight osteophytes on the acetabulum.  IMPRESSION: Comminuted but nondisplaced fracture of the superior and anterior medial aspects of the right acetabulum.   Original Report Authenticated By: Francene Boyers, M.D.    Dg Chest Port 1 View  07/07/2012  *RADIOLOGY REPORT*  Clinical Data: 79 year old male with hip fracture.  Fall.  PORTABLE CHEST - 1 VIEW  Comparison: 06/10/2010 earlier.  Findings: Portable semi upright AP view 1940 hours.  Improved lung volumes.  Stable cardiac size and mediastinal contours.  No pneumothorax, pulmonary edema, pleural effusion or consolidation.  IMPRESSION: Improved lung volumes. No acute cardiopulmonary abnormality.   Original Report Authenticated By: Erskine Speed, M.D.     Scheduled Meds: . amLODipine  5 mg Oral Daily   And  . benazepril  20 mg Oral Daily  . atorvastatin  40 mg Oral q1800  . enoxaparin (LOVENOX) injection  30 mg Subcutaneous Q24H  . insulin aspart  0-15 Units Subcutaneous TID WC  . insulin glargine  60 Units Subcutaneous Daily  . levofloxacin (LEVAQUIN) IV  500 mg Intravenous Q24H  . vancomycin  1,000 mg Intravenous Q12H   Continuous Infusions: . sodium chloride 75 mL/hr at 07/08/12 1127    Active Problems:   Ulcer of lower limb, unspecified   Right acetabular fracture   DM type 2 (diabetes mellitus, type 2)   Cellulitis of left lower extremity   Abnormal EKG    Time spent:    [facility]  [facility] Pager [phone]. If 7PM-7AM, please contact night-coverage at [email or web address], password [facility] 07/08/2012, 2:56 PM  LOS: 1 day

## 2012-07-08 NOTE — Progress Notes (Signed)
UR chart review completed.  

## 2012-07-08 NOTE — Progress Notes (Signed)
*  PRELIMINARY RESULTS* Echocardiogram 2D Echocardiogram has been performed.  Shooter Tangen 07/08/2012, 3:24 PM  [employer], RDCS

## 2012-07-09 LAB — CBC
Hemoglobin: 14.4 g/dL (ref 13.0–17.0)
MCH: 30.6 pg (ref 26.0–34.0)
MCHC: 34.7 g/dL (ref 30.0–36.0)
Platelets: 196 10*3/uL (ref 150–400)

## 2012-07-09 LAB — GLUCOSE, CAPILLARY
Glucose-Capillary: 224 mg/dL — ABNORMAL HIGH (ref 70–99)
Glucose-Capillary: 100 mg/dL — ABNORMAL HIGH (ref 70–99)
Glucose-Capillary: 100 mg/dL — ABNORMAL HIGH (ref 70–99)

## 2012-07-09 LAB — BASIC METABOLIC PANEL
BUN: 16 mg/dL (ref 6–23)
Calcium: 9 mg/dL (ref 8.4–10.5)
GFR calc Af Amer: >90 mL/min (ref >90)
GFR calc non Af Amer: 85 mL/min — ABNORMAL LOW (ref >90)
Potassium: 3.6 mEq/L (ref 3.5–5.1)
Sodium: 137 mEq/L (ref 135–145)

## 2012-07-09 LAB — URINE CULTURE: Culture: NO GROWTH

## 2012-07-09 MED ORDER — MORPHINE SULFATE 2 MG/ML IJ SOLN
2.0000 mg | INTRAMUSCULAR | Status: DC | PRN
  Administered 2012-07-09 – 2012-07-10 (×2): 2 mg via INTRAVENOUS
  Filled 2012-07-09 (×3): qty 1

## 2012-07-09 NOTE — Progress Notes (Signed)
Physical Therapy Treatment Patient Details Name: Garrett [name] MRN: [id number] DOB: April 30, 1933 Today's Date: 07/09/2012 Time: 4401-0272 PT Time Calculation (min): 30 min  PT Assessment / Plan / Recommendation Comments on Treatment Session  Pt instructued therex for overall LE strengthening and reviewed weight bearing status with sit to stand as well as proper hand placement to assist with sit to stand.  Pt limited byfatigue end of session, reported pain scale 2/10.    Follow Up Recommendations        Does the patient have the potential to tolerate intense rehabilitation     Barriers to Discharge        Equipment Recommendations       Recommendations for Other Services    Frequency     Plan      Precautions / Restrictions Precautions Precautions: Fall Restrictions Weight Bearing Restrictions: Yes RLE Weight Bearing: Non weight bearing   Pertinent Vitals/Pain     Mobility  Bed Mobility Bed Mobility: Supine to Sit;Sit to Supine;Scooting to [facility];Sitting - Scoot to Edge of Bed Supine to Sit: 5: Supervision Sitting - Scoot to Edge of Bed: 4: Min assist;With rail Sit to Supine: 4: Min assist;HOB flat;With rail Scooting to [facility]: 3: Mod assist;With rail Details for Bed Mobility Assistance: cueing for handplacement on the bed.  Transfers Transfers: Sit to Stand;Stand to Sit Sit to Stand:  (3 reps to assure NWB and proper handplacement) Stand to Sit:  (3 reps to assure NWB and cueing for proper handplacement) Details for Transfer Assistance: cueing for NWB to Rt LE    Exercises General Exercises - Lower Extremity Ankle Circles/Pumps: AROM;10 reps;Both Quad Sets: AROM;Right;10 reps Gluteal Sets: AROM;Both;10 reps Short Arc Quad: AROM;Right;10 reps Long Arc Quad: AROM;Right;10 reps Heel Slides: AAROM;5 reps;Right   PT Diagnosis:    PT Problem List:   PT Treatment Interventions:     PT Goals Acute Rehab PT Goals PT Goal: Supine/Side to Sit - Progress: Progressing toward  goal PT Goal: Sit to Stand - Progress: Progressing toward goal PT Goal: Ambulate - Progress: Not met PT Goal: Up/Down Stairs - Progress: Not met  Visit Information  Last PT Received On: 07/09/12    Subjective Data  Subjective: Pt reported he is feeling better today, hip doesnt bother unless in certain movements.     Cognition  Cognition Arousal/Alertness: Awake/alert Behavior During Therapy: WFL for tasks assessed/performed Overall Cognitive Status: Within Functional Limits for tasks assessed    Balance     End of Session PT - End of Session Equipment Utilized During Treatment: Gait belt Activity Tolerance: Patient tolerated treatment well Patient left: in bed;with call bell/phone within reach;with bed alarm set;with family/visitor present Nurse Communication: Mobility status   GP     Juel Burrow 07/09/2012, 7:06 PM

## 2012-07-09 NOTE — Clinical Social Work Note (Signed)
CSW presented bed offers and pt chooses [facility]. Facility notified. Possible d/c tomorrow. Social worker at [facility] to complete paperwork with pt this afternoon in hospital.   Derenda Fennel, LCSW [phone]

## 2012-07-09 NOTE — Progress Notes (Signed)
ANTIBIOTIC CONSULT NOTE   Pharmacy Consult for Vancomycin Indication: cellulitis  No Known Allergies  Patient Measurements: Height: 5\' 9"  (175.3 cm) Weight:  (bed has no scale and pt has fracture) IBW/kg (Calculated) : 70.7  Vital Signs: Temp: 98 F (36.7 C) (04/Garrett 0433) Temp src: Oral (04/Garrett 0433) BP: 158/88 mmHg (04/Garrett 0433) Pulse Rate: 62 (04/Garrett 0433) Intake/Output from previous day: 04/24 0701 - 04/Garrett 0700 In: 1040 [P.O.:1040] Out: 2675 [Urine:2675] Intake/Output from this shift: Total I/O In: -  Out: 400 [Urine:400]  Labs:  Recent Labs  07/07/12 1925 07/08/12 0554 04/Garrett/14 0534  WBC 14.9* 11.7* 11.4*  HGB 14.1 13.3 14.4  PLT 209 181 196  CREATININE 0.76 0.91 0.79   Estimated Creatinine Clearance: 78.6 ml/min (by C-G formula based on Cr of 0.79). No results found for this basename: VANCOTROUGH, VANCOPEAK, VANCORANDOM, GENTTROUGH, GENTPEAK, GENTRANDOM, TOBRATROUGH, TOBRAPEAK, TOBRARND, AMIKACINPEAK, AMIKACINTROU, AMIKACIN,  in the last 72 hours   Microbiology: No results found for this or any previous visit (from the past 720 hour(s)).  Medical History: Past Medical History  Diagnosis Date  . Diabetes mellitus without complication   . Hypertension   . Hypercholesterolemia   . SDH (subdural hematoma) 05/2010    due to fall  . Thoracic aortic aneurysm without rupture     Medications:  Scheduled:  . amLODipine  5 mg Oral Daily   And  . benazepril  20 mg Oral Daily  . atorvastatin  40 mg Oral q1800  . enoxaparin (LOVENOX) injection  40 mg Subcutaneous Q24H  . insulin aspart  0-15 Units Subcutaneous TID WC  . insulin glargine  60 Units Subcutaneous Daily  . levofloxacin (LEVAQUIN) IV  750 mg Intravenous Q24H  . vancomycin  1,000 mg Intravenous Q12H  . [DISCONTINUED] enoxaparin (LOVENOX) injection  30 mg Subcutaneous Q24H  . [DISCONTINUED] levofloxacin (LEVAQUIN) IV  500 mg Intravenous Q24H   Assessment: 79 yo Garrett [name] admitted with BLE cellulitis to start  on Vancomycin & Levaquin.  This appears to be a chronic condition for which he is seen by the wound clinic & has been previously treated with antibiotics that has recently worsened.   Renal function is at patient's baseline.  He is currently afebrile with slightly elevated WBC.     Antibiotics:  Vancomycin 4/24  Levaquin 4/23   Goal of Therapy:  Vancomycin trough level 10-15 mcg/ml  Plan:  Vancomycin 1gm IV Q12h Check Vancomycin trough tonight Levaquin 750mg  IV q24hrs Monitor renal function and cx data   Valrie Hart A 4/Garrett/2014,10:59 AM

## 2012-07-09 NOTE — Progress Notes (Signed)
Subjective: Doing well severe pain last night moderate today    Objective: Vital signs in last 24 hours: Temp:  [98 F (36.7 C)-98.8 F (37.1 C)] 98 F (36.7 C) (04/25 0433) Pulse Rate:  [62-73] 62 (04/25 0433) Resp:  [18] 18 (04/25 0433) BP: (153-170)/(68-88) 158/88 mmHg (04/25 0433) SpO2:  [96 %-97 %] 96 % (04/25 0433)  Intake/Output from previous day: 04/24 0701 - 04/25 0700 In: 1040 [P.O.:1040] Out: 2675 [Urine:2675] Intake/Output this shift: Total I/O In: -  Out: 400 [Urine:400]   Recent Labs  07/07/12 1925 07/08/12 0554 07/09/12 0534  HGB 14.1 13.3 14.4    Recent Labs  07/08/12 0554 07/09/12 0534  WBC 11.7* 11.4*  RBC 4.35 4.70  HCT 38.4* 41.5  PLT 181 196    Recent Labs  07/08/12 0554 07/09/12 0534  NA 137 137  K 4.0 3.6  CL 103 103  CO2 26 27  BUN 24* 16  CREATININE 0.91 0.79  GLUCOSE 209* 125*  CALCIUM 8.8 9.0    Recent Labs  07/07/12 1925  INR 1.10    mild lower leg cellulitis and pain with hip ROM legs are equal length   Assessment/Plan: Iv vanco for cellulitis   NWB for right hip   Discharge tomorrow    Fuller Canada 07/09/2012, 12:51 PM

## 2012-07-09 NOTE — Clinical Social Work Placement (Signed)
Clinical Social Work Department CLINICAL SOCIAL WORK PLACEMENT NOTE 07/09/2012  Patient:  AYSEN, SHIEH  Account Number:  [id number] Admit date:  07/07/2012  Clinical Social Worker:  Derenda Fennel, LCSW  Date/time:  07/08/2012 12:07 PM  Clinical Social Work is seeking post-discharge placement for this patient at the following level of care:   SKILLED NURSING   (*CSW will update this form in Epic as items are completed)   07/08/2012  Patient/family provided with [facility] Department of Clinical Social Work's list of facilities offering this level of care within the geographic area requested by the patient (or if unable, by the patient's family).  07/08/2012  Patient/family informed of their freedom to choose among providers that offer the needed level of care, that participate in Medicare, Medicaid or managed care program needed by the patient, have an available bed and are willing to accept the patient.  07/08/2012  Patient/family informed of [facility]' ownership interest in [facility], as well as of the fact that they are under no obligation to receive care at this facility.  PASARR submitted to EDS on  PASARR number received from EDS on   FL2 transmitted to all facilities in geographic area requested by pt/family on  07/08/2012 FL2 transmitted to all facilities within larger geographic area on   Patient informed that his/her managed care company has contracts with or will negotiate with  certain facilities, including the following:     Patient/family informed of bed offers received:  07/09/2012 Patient chooses bed at [facility] Physician recommends and patient chooses bed at  [facility]  Patient to be transferred to  on   Patient to be transferred to facility by   The following physician request were entered in Epic:   Additional Comments: existing pasarr number  Derenda Fennel, LCSW [phone]

## 2012-07-09 NOTE — Progress Notes (Signed)
[facility] PROGRESS NOTE  Garrett [name] ZOX:[id number] DOB: 1933/07/15 DOA: 07/07/2012 PCP: Dwana Melena, MD  Assessment/Plan: 1. Cellulitis/venous stasis of lower extremities, left greater than right. Patient is on intravenous antibiotics. His leukocytosis is stable. Wound care recommendations are appreciated. He will need to followup with the [facility] after discharge. We'll need to keep legs elevated for now. Continue current antibiotics. Erythema does not appear to be worsening and he is afebrile. Would transition to po bactrim DS BID for 7 more days starting tomorrow. 2. Diabetes. Continue to follow CBGs. He blood sugars are better today. Hemoglobin A1c is 10. Continue lantus and sliding scale insulin. 3. Right acetabular fracture. Per orthopedics 4. Abnormal EKG. Patient does not have any chest pains. Echocardiogram is unremarkable.  Can follow up with cardiology as outpatient. 5. Hypertension. Controlled.  Family Communication: Discussed with patient Disposition Plan: Agree with discharge to SNF tomorrow.   Procedures:  None  Antibiotics:  Vancomycin 4/24  Levaquin 4/23  HPI/Subjective: No new complaints.  Objective: Filed Vitals:   07/08/12 1348 07/08/12 2044 07/09/12 0433 07/09/12 1330  BP: 153/68 170/74 158/88 156/75  Pulse: 73 68 62 72  Temp: 98.8 F (37.1 C) 98.6 F (37 C) 98 F (36.7 C) 98.1 F (36.7 C)  TempSrc: Oral Oral Oral Oral  Resp: 18 18 18 18   Height:      Weight:      SpO2: 97% 96% 96% 97%    Intake/Output Summary (Last 24 hours) at 07/09/12 1702 Last data filed at 07/09/12 1400  Gross per 24 hour  Intake    720 ml  Output   2600 ml  Net  -1880 ml   Filed Weights   07/07/12 1536  Weight: 81.647 kg (180 lb)    Exam:   General:  No acute distress   Cardiovascular:S1, S2, regular rate and rhythm  Respiratory: Clear to auscultation bilaterally  Abdomen: Soft, nontender, nondistended, bowel sounds are  active  Musculoskeletal: Venous stasis changes noted in the lower extremities, trace pedal edema bilaterally, erythema and warmth noted in bilateral lower extremities.   Data Reviewed: Basic Metabolic Panel:  Recent Labs Lab 07/07/12 1925 07/08/12 0554 07/09/12 0534  NA 139 137 137  K 3.3* 4.0 3.6  CL 103 103 103  CO2 27 26 27   GLUCOSE 64* 209* 125*  BUN 28* 24* 16  CREATININE 0.76 0.91 0.79  CALCIUM 9.6 8.8 9.0   Liver Function Tests:  Recent Labs Lab 07/08/12 0554  AST 20  ALT 19  ALKPHOS 66  BILITOT 0.6  PROT 6.2  ALBUMIN 2.9*   No results found for this basename: LIPASE, AMYLASE,  in the last 168 hours No results found for this basename: AMMONIA,  in the last 168 hours CBC:  Recent Labs Lab 07/07/12 1925 07/08/12 0554 07/09/12 0534  WBC 14.9* 11.7* 11.4*  NEUTROABS 12.6*  --   --   HGB 14.1 13.3 14.4  HCT 40.4 38.4* 41.5  MCV 87.4 88.3 88.3  PLT 209 181 196   Cardiac Enzymes:  Recent Labs Lab 07/07/12 1925  TROPONINI <0.30   BNP (last 3 results) No results found for this basename: PROBNP,  in the last 8760 hours CBG:  Recent Labs Lab 07/08/12 1640 07/08/12 2108 07/09/12 0743 07/09/12 1128 07/09/12 1621  GLUCAP 208* 133* 104* 224* 100*    Recent Results (from the past 240 hour(s))  URINE CULTURE     Status: None   Collection Time    07/07/12  10:22 PM      Result Value Range Status   Specimen Description URINE, CLEAN CATCH   Final   Special Requests NONE   Final   Culture  Setup Time 07/08/2012 16:11   Final   Colony Count NO GROWTH   Final   Culture NO GROWTH   Final   Report Status 07/09/2012 FINAL   Final     Studies: Ct Hip Right Wo Contrast  07/07/2012  *RADIOLOGY REPORT*  Clinical Data: Right hip pain secondary to a fall.  Indeterminate x- rays.  CT OF THE RIGHT HIP WITHOUT CONTRAST  Technique:  Multidetector CT imaging was performed according to the standard protocol. Multiplanar CT image reconstructions were also  generated.  Comparison:  Radiographs dated 07/07/2012  Findings: There is a subtle  comminuted fracture of the superior aspect of the acetabulum extending into the medial aspect of the acetabulum anteriorly without displacement.  Right femoral head and neck are intact.  The intertrochanteric region is normal.  The inferior and superior pubic rami are intact.  Slight osteophytes on the acetabulum.  IMPRESSION: Comminuted but nondisplaced fracture of the superior and anterior medial aspects of the right acetabulum.   Original Report Authenticated By: Francene Boyers, M.D.    Dg Chest Port 1 View  07/07/2012  *RADIOLOGY REPORT*  Clinical Data: 79 year old male with hip fracture.  Fall.  PORTABLE CHEST - 1 VIEW  Comparison: 06/10/2010 earlier.  Findings: Portable semi upright AP view 1940 hours.  Improved lung volumes.  Stable cardiac size and mediastinal contours.  No pneumothorax, pulmonary edema, pleural effusion or consolidation.  IMPRESSION: Improved lung volumes. No acute cardiopulmonary abnormality.   Original Report Authenticated By: Erskine Speed, M.D.     Scheduled Meds: . amLODipine  5 mg Oral Daily   And  . benazepril  20 mg Oral Daily  . atorvastatin  40 mg Oral q1800  . enoxaparin (LOVENOX) injection  40 mg Subcutaneous Q24H  . insulin aspart  0-15 Units Subcutaneous TID WC  . insulin glargine  60 Units Subcutaneous Daily  . levofloxacin (LEVAQUIN) IV  750 mg Intravenous Q24H  . vancomycin  1,000 mg Intravenous Q12H   Continuous Infusions: . sodium chloride 75 mL/hr at 07/09/12 1637    Active Problems:   Ulcer of lower limb, unspecified   Right acetabular fracture   DM type 2 (diabetes mellitus, type 2)   Cellulitis of left lower extremity   Abnormal EKG    Time spent:    [facility]  [facility] Pager [phone]. If 7PM-7AM, please contact night-coverage at [email or web address], password [email or web address] 07/09/2012, 5:02 PM  LOS: 2 days

## 2012-07-09 NOTE — Progress Notes (Signed)
Patient complained of increased pain from exercises and uncontrolled pain disturbing sleep the previous night. MD notified and orders followed.

## 2012-07-09 NOTE — Progress Notes (Signed)
Inpatient Diabetes Program Recommendations  AACE/ADA: New Consensus Statement on Inpatient Glycemic Control (2013)  Target Ranges:  Prepandial:   less than 140 mg/dL      Peak postprandial:   less than 180 mg/dL (1-2 hours)      Critically ill patients:  140 - 180 mg/dL   Results for Garrett [name], Garrett [name] (MRN [id number]) as of 07/09/2012 08:58  Ref. Range 07/08/2012 08:10 07/08/2012 11:23 07/08/2012 16:40 07/08/2012 21:08 07/09/2012 07:43  Glucose-Capillary Latest Range: 70-99 mg/dL 119 (H) 147 (H) 829 (H) 133 (H) 104 (H)  Results for Garrett [name], Garrett [name] (MRN [id number]) as of 07/09/2012 08:58  Ref. Range 07/07/2012 22:14  Hemoglobin A1C Latest Range: <5.7 % 10.0 (H)    Inpatient Diabetes Program Recommendations Correction (SSI): Please consider increasing Novolog correction scale to resistant.  Note: Blood glucose has ranged from 104-225 mg/dl over the past 24 hours and fasting blood glucose this morning was 104 mg/dl at 7:84.  Please consider increasing Novolog correction to the resistant scale to further improve glycemic control.  Will continue to follow.  Thanks, Orlando Penner, RN, BSN, CCRN Diabetes Coordinator Inpatient Diabetes Program [phone]

## 2012-07-09 NOTE — Clinical Social Work Placement (Deleted)
Clinical Social Work Department CLINICAL SOCIAL WORK PLACEMENT NOTE 07/09/2012  Patient:  Garrett [name]  Account Number:  [id number] Admit date:  07/05/2012  Clinical Social Worker:  Derenda Fennel, LCSW  Date/time:  07/09/2012 10:18 AM  Clinical Social Work is seeking post-discharge placement for this patient at the following level of care:   SKILLED NURSING   (*CSW will update this form in Epic as items are completed)   07/09/2012  Patient/family provided with [facility] Department of Clinical Social Work's list of facilities offering this level of care within the geographic area requested by the patient (or if unable, by the patient's family).  07/09/2012  Patient/family informed of their freedom to choose among providers that offer the needed level of care, that participate in Medicare, Medicaid or managed care program needed by the patient, have an available bed and are willing to accept the patient.  07/09/2012  Patient/family informed of [facility]' ownership interest in [facility], as well as of the fact that they are under no obligation to receive care at this facility.  PASARR submitted to EDS on  PASARR number received from EDS on   FL2 transmitted to all facilities in geographic area requested by pt/family on  07/09/2012 FL2 transmitted to all facilities within larger geographic area on 07/09/2012  Patient informed that his/her managed care company has contracts with or will negotiate with  certain facilities, including the following:     Patient/family informed of bed offers received:  07/09/2012 Patient chooses bed at [facility] Physician recommends and patient chooses bed at  [facility]  Patient to be transferred to  on   Patient to be transferred to facility by   The following physician request were entered in Epic:   Additional Comments: existing pasarr number  Derenda Fennel, LCSW [phone]

## 2012-07-10 ENCOUNTER — Inpatient Hospital Stay
Admission: RE | Admit: 2012-07-10 | Discharge: 2012-07-18 | Disposition: A | Discharge status: Other Acute Inpatient Hospital | Payer: BC Managed Care – PPO | Source: Ambulatory Visit | Attending: Internal Medicine | Admitting: Internal Medicine

## 2012-07-10 LAB — GLUCOSE, CAPILLARY

## 2012-07-10 MED ORDER — LEVOFLOXACIN 750 MG PO TABS: 750.0000 mg | ORAL_TABLET | Freq: Every day | ORAL | Status: DC

## 2012-07-10 MED ORDER — ENOXAPARIN SODIUM 40 MG/0.4ML ~~LOC~~ SOLN: 40.0000 mg | SUBCUTANEOUS | Status: DC

## 2012-07-10 MED ORDER — SODIUM CHLORIDE 0.9 % IV SOLN
1250.0000 mg | Freq: Two times a day (BID) | INTRAVENOUS | Status: DC
  Filled 2012-07-10 (×5): qty 1250

## 2012-07-10 MED ORDER — HYDROCODONE-ACETAMINOPHEN 5-325 MG PO TABS: 1.0000 | ORAL_TABLET | ORAL | Status: DC | PRN

## 2012-07-10 NOTE — Progress Notes (Signed)
07/10/12 1118 Patient being discharged to [facility]. D/C summary and [facility] as requested per cathy, admission nurse this morning. Called report to [place], receiving nurse. Requested patient transfer after lunch. IV site d/c'd this morning and within normal limits. Pt states his daughter is aware of discharge today. Pt in stable condition awaiting transport to [facility]. Earnstine Regal, RN

## 2012-07-10 NOTE — Progress Notes (Addendum)
ANTIBIOTIC CONSULT NOTE   Pharmacy Consult for Vancomycin Indication: cellulitis  No Known Allergies  Patient Measurements: Height: 5\' 9"  (175.3 cm) Weight:  (bed has no scale and pt has fracture) IBW/kg (Calculated) : 70.7  Vital Signs: Temp: 98.3 F (36.8 C) (04/26 0500) Temp src: Oral (04/25 2134) BP: 165/83 mmHg (04/26 0500) Pulse Rate: 71 (04/26 0500) Intake/Output from previous day: 04/25 0701 - 04/26 0700 In: 600 [P.O.:600] Out: 2725 [Urine:2725] Intake/Output from this shift: Total I/O In: 240 [P.O.:240] Out: 450 [Urine:450]  Labs:  Recent Labs  07/07/12 1925 07/08/12 0554 07/09/12 0534  WBC 14.9* 11.7* 11.4*  HGB 14.1 13.3 14.4  PLT 209 181 196  CREATININE 0.76 0.91 0.79   Estimated Creatinine Clearance: 78.6 ml/min (by C-G formula based on Cr of 0.79).  Recent Labs  07/09/12 2250  VANCORANDOM 9.4    Microbiology: Recent Results (from the past 720 hour(s))  URINE CULTURE     Status: None   Collection Time    07/07/12 10:22 PM      Result Value Range Status   Specimen Description URINE, CLEAN CATCH   Final   Special Requests NONE   Final   Culture  Setup Time 07/08/2012 16:11   Final   Colony Count NO GROWTH   Final   Culture NO GROWTH   Final   Report Status 07/09/2012 FINAL   Final   Medical History: Past Medical History  Diagnosis Date  . Diabetes mellitus without complication   . Hypertension   . Hypercholesterolemia   . SDH (subdural hematoma) 05/2010    due to fall  . Thoracic aortic aneurysm without rupture    Medications:  Scheduled:  . amLODipine  5 mg Oral Daily   And  . benazepril  20 mg Oral Daily  . atorvastatin  40 mg Oral q1800  . enoxaparin (LOVENOX) injection  40 mg Subcutaneous Q24H  . insulin aspart  0-15 Units Subcutaneous TID WC  . insulin glargine  60 Units Subcutaneous Daily  . levofloxacin (LEVAQUIN) IV  750 mg Intravenous Q24H  . vancomycin  1,000 mg Intravenous Q12H   Assessment: 79 yo M admitted with  BLE cellulitis to start on Vancomycin & Levaquin.  This appears to be a chronic condition for which he is seen by the wound clinic & has been previously treated with antibiotics that has recently worsened.  Renal function is at patient's baseline.  Urine cx is negative.  He is afebrile with slightly elevated WBC.  Vancomycin trough level is below goal.   Antibiotics:  Vancomycin 4/24  Levaquin 4/23   Goal of Therapy:  Vancomycin trough level 10-15 mcg/ml  Plan:  Increase Vancomycin to 1250mg  IV Q12h Check Vancomycin trough weekly Levaquin 750mg  PO q24hrs Monitor renal function and cx data   Valrie Hart A 07/10/2012,8:43 AM

## 2012-07-10 NOTE — Progress Notes (Signed)
PHARMACIST - PHYSICIAN COMMUNICATION DR:   Romeo Apple CONCERNING: Antibiotic IV to Oral Route Change Policy  RECOMMENDATION: This patient is receiving Levaquin by the intravenous route.  Based on criteria approved by the Pharmacy and Therapeutics Committee, the antibiotic(s) is/are being converted to the equivalent oral dose form(s).   DESCRIPTION: These criteria include:  Patient being treated for a respiratory tract infection, urinary tract infection, or cellulitis  The patient is not neutropenic and does not exhibit a GI malabsorption state  The patient is eating (either orally or via tube) and/or has been taking other orally administered medications for a least 24 hours  The patient is improving clinically and has a Tmax < 100.5  If you have questions about this conversion, please contact the Pharmacy Department  [x]   [phone] )  Garrett [name] []   [phone] )  Garrett [name]  []   [phone] )  [facility] []   [phone] )  [facility]   S. Margo Aye, PharmD

## 2012-07-10 NOTE — Progress Notes (Signed)
[id number] Patient discharged to [facility]. Pt left floor in stable condition via bed accompanied by nurse techs. Discharge packet sent with patient. Received call from patient's daughter Jannet Mantis, aware of patient discharge. Earnstine Regal, RN

## 2012-07-10 NOTE — Discharge Summary (Signed)
Physician Discharge Summary  Patient ID: Garrett [name] MRN: [id number] DOB/AGE: 1933-12-17 79 y.o.  Admit date: 07/07/2012 Discharge date: 07/10/2012  Admission Diagnoses: The primary encounter diagnosis was Right acetabular fracture, closed, initial encounter. Diagnoses of Abnormal EKG, Cellulitis of left lower extremity, DM type 2 (diabetes mellitus, type 2), HTN (hypertension), benign, and Ulcer of lower limb, unspecified were also pertinent to this visit.   Discharge Diagnoses:  Active Problems:   Right acetabular fracture   Ulcer of lower limb, unspecified   DM type 2 (diabetes mellitus, type 2)   Cellulitis of left lower extremity   Abnormal EKG   Discharged Condition: stable  Hospital Course: unremarkable  He came in for acetabular fracture was treated with PT and analgesics. He was found to have cellulitis and was traeted with wound care and vancomycin/wound care consult. Also had abnormal ekg with no symptoms.    Discharge Exam: Blood pressure 165/83, pulse 71, temperature 98.3 F (36.8 C), temperature source Oral, resp. rate 20, height 5\' 9"  (1.753 m), weight 180 lb (81.647 kg), SpO2 95.00%. AAO X 3 HIP ROM MILD PAIN  CELLULITIS MILD  ULCERS SHALLOW   Disposition: 03-Skilled Nursing Facility  Discharge Orders   Future Orders Complete By Expires     Diet - low sodium heart healthy  As directed     Discharge instructions  As directed     Comments:      No weight on right leg for 6 weeks    Discharge wound care:  As directed     Comments:      Continue current dressing on legs  Follow with wound care center/Call Dr Margo Aye for instructions    Increase activity slowly  As directed         Medication List    TAKE these medications       amLODipine-benazepril 5-20 MG per capsule  Commonly known as:  LOTREL  Take 1 capsule by mouth daily.     enoxaparin 40 MG/0.4ML injection  Commonly known as:  LOVENOX  Inject 0.4 mLs (40 mg total) into the skin daily.     HYDROcodone-acetaminophen 5-325 MG per tablet  Commonly known as:  NORCO/VICODIN  Take 1 tablet by mouth every 4 (four) hours as needed.     insulin aspart 100 UNIT/ML injection  Commonly known as:  novoLOG  Inject 1-10 Units into the skin 3 (three) times daily with meals.     insulin glargine 100 UNIT/ML injection  Commonly known as:  LANTUS  Inject 62-80 Units into the skin daily.     levofloxacin 750 MG tablet  Commonly known as:  LEVAQUIN  Take 1 tablet (750 mg total) by mouth daily at 6 PM.     metFORMIN 500 MG tablet  Commonly known as:  GLUCOPHAGE  Take 500 mg by mouth 2 (two) times daily with a meal.     rosuvastatin 20 MG tablet  Commonly known as:  CRESTOR  Take 20 mg by mouth daily.           Follow-up Information   Follow up with Fuller Canada, MD In 6 weeks. (rt hip xrays )    Contact information:   [address], [address], [place] [address] [phone]       Follow up with [facility], MD In 2 weeks.   Contact information:   [address] [place] [address] [phone]       Signed: Fuller Canada 07/10/2012,  9:16 AM

## 2012-07-12 ENCOUNTER — Non-Acute Institutional Stay (SKILLED_NURSING_FACILITY): Payer: Medicare Other | Admitting: Internal Medicine

## 2012-07-12 ENCOUNTER — Other Ambulatory Visit: Payer: Self-pay | Admitting: *Deleted

## 2012-07-12 DIAGNOSIS — L02419 Cutaneous abscess of limb, unspecified: Secondary | ICD-10-CM

## 2012-07-12 DIAGNOSIS — I8310 Varicose veins of unspecified lower extremity with inflammation: Secondary | ICD-10-CM

## 2012-07-12 DIAGNOSIS — L03119 Cellulitis of unspecified part of limb: Secondary | ICD-10-CM

## 2012-07-12 DIAGNOSIS — S32409D Unspecified fracture of unspecified acetabulum, subsequent encounter for fracture with routine healing: Secondary | ICD-10-CM

## 2012-07-12 DIAGNOSIS — S72009D Fracture of unspecified part of neck of unspecified femur, subsequent encounter for closed fracture with routine healing: Secondary | ICD-10-CM

## 2012-07-12 LAB — GLUCOSE, CAPILLARY
Glucose-Capillary: 67 mg/dL — ABNORMAL LOW (ref 70–99)
Glucose-Capillary: 248 mg/dL — ABNORMAL HIGH (ref 70–99)
Glucose-Capillary: 214 mg/dL — ABNORMAL HIGH (ref 70–99)
Glucose-Capillary: 202 mg/dL — ABNORMAL HIGH (ref 70–99)

## 2012-07-12 MED ORDER — HYDROCODONE-ACETAMINOPHEN 5-325 MG PO TABS: ORAL_TABLET | ORAL | Status: AC

## 2012-07-13 LAB — GLUCOSE, CAPILLARY
Glucose-Capillary: 93 mg/dL (ref 70–99)
Glucose-Capillary: 115 mg/dL — ABNORMAL HIGH (ref 70–99)
Glucose-Capillary: 124 mg/dL — ABNORMAL HIGH (ref 70–99)
Glucose-Capillary: 128 mg/dL — ABNORMAL HIGH (ref 70–99)

## 2012-07-14 LAB — GLUCOSE, CAPILLARY
Glucose-Capillary: 88 mg/dL (ref 70–99)
Glucose-Capillary: 163 mg/dL — ABNORMAL HIGH (ref 70–99)
Glucose-Capillary: 114 mg/dL — ABNORMAL HIGH (ref 70–99)

## 2012-07-15 LAB — GLUCOSE, CAPILLARY
Glucose-Capillary: 123 mg/dL — ABNORMAL HIGH (ref 70–99)
Glucose-Capillary: 199 mg/dL — ABNORMAL HIGH (ref 70–99)
Glucose-Capillary: 179 mg/dL — ABNORMAL HIGH (ref 70–99)

## 2012-07-16 LAB — GLUCOSE, CAPILLARY
Glucose-Capillary: 196 mg/dL — ABNORMAL HIGH (ref 70–99)
Glucose-Capillary: 241 mg/dL — ABNORMAL HIGH (ref 70–99)

## 2012-07-16 NOTE — Progress Notes (Signed)
Patient ID: Garrett [name], male   DOB: 1933/06/25, 79 y.o.   MRN: [id number]           HISTORY & PHYSICAL  DATE:  07/12/2012  FACILITY: [facility]   LEVEL OF CARE:   SNF   CHIEF COMPLAINT:  Status post admission to [facility], 07/07/2012 through 07/10/2012.  HISTORY OF PRESENT ILLNESS:  This patient apparently fell getting out of his lift-chair at home.  He was ultimately diagnosed with a right acetabular fracture which was diagnosed by CT scan of the hip.  He apparently did not want surgery and he was felt not to be a good surgical candidate.    He had apparently recently been treated for cellulitis of the right lower extremity as well as what appears to be venous insufficiency and a wound secondary to both of the above.    PAST MEDICAL HISTORY:  Type 2 diabetes.    Hypertension.    Hypercholesterolemia.   Subdural hematoma, apparently secondary to a fall in March 2012.   Thoracic aortic aneurysm without rupture.   PAST SURGICAL HISTORY:  Inguinal hernia repair bilaterally.    CURRENT MEDICATIONS:    Lantus insulin 60 U a day, a sliding scale as well as metformin.    Crestor 20 mg a day.    Amlodipine/benazepril 5/20 a day.    Enoxaparin 40 mg daily for DVT prophylaxis.    Levaquin 750 a day.    SOCIAL HISTORY: HOUSING:  The patient lives in [place] on his own.   FUNCTIONAL STATUS:  He has a lift-chair and a Brocious at home.  He tells me that he does not have a history of falls.  However, I note the subdural hematoma two years ago.  REVIEW OF SYSTEMS:   CHEST/RESPIRATORY:  He is not complaining of shortness of breath.  Quit smoking 25 years ago.   CARDIAC:   No chest pain.  GI:  No nausea, vomiting, abdominal pain, or diarrhea.   GU:  No incontinence or dysuria.   MUSCULOSKELETAL:  He does not complain of a lot of pain.  In fact, he tells me that he actually feels like he could walk on this.    PHYSICAL EXAMINATION:   GENERAL APPEARANCE:  The  patient is in no distress.   HEENT:   MOUTH/THROAT:  Lips without lesions.  No lesions noted in mouth.  Tongue is without lesions.  Oropharynx without redness or lesions.  Uvula elevates midline.  Teeth are in good repair.   CHEST/RESPIRATORY:  Air entry is equal and clear bilaterally.  CARDIOVASCULAR:  CARDIAC:   Heart sounds are normal.  No murmurs.  No increase in jugular venous pressure.   GASTROINTESTINAL:  ABDOMEN:   Soft.  No masses.   LIVER/SPLEEN/KIDNEYS:  No liver, no spleen.   GENITOURINARY:  BLADDER:   No bladder distention.    SKIN:  INSPECTION:   Extremities:  He has bilateral edema and erythema, left greater than right.  He has shallow ulcers on his left leg as well as an open wound on the posterior aspect of his left leg.   CIRCULATION:  ARTERIAL:  Pulses are palpable at the dorsalis pedis and posterior tibial.     ASSESSMENT/PLAN:  Fall with right acetabular fracture.  He is being managed nonoperatively with non-weightbearing.  I wonder about his ability to live independently at home without the weightbearing status.   I believe he is going to be non-weightbearing for six weeks.  Follows with  Dr. Romeo Apple.    Erythema in the lower legs, left greater than right, together with lower extremity wounds which are superficial.    Although this redness, erythema and swelling always raises cellulitis, I have seen people with chronic venous stasis present just like this and it is a very difficult clinical distinction.  I will continue the Levaquin, dress his legs with alginate and Kerlix/Coban wraps which can be changed daily.    Type 2 diabetes.  He has absent reflexes bilaterally.  His lower extremity strength is fairly good, however.  I wonder about his actual gait at home and how functional this is.    Hyperlipidemia.  On a statin.   Hypertension.  Medications as noted above.  This will be followed.    He is on enoxaparin for DVT prophylaxis.  I wonder about changing this to  Xarelto.    I will continue the Levaquin for another week.  Monitor the erythema.     CPT CODE: 16109

## 2012-07-17 LAB — GLUCOSE, CAPILLARY: Glucose-Capillary: 168 mg/dL — ABNORMAL HIGH (ref 70–99)

## 2012-07-18 ENCOUNTER — Encounter (HOSPITAL_COMMUNITY): Payer: Self-pay

## 2012-07-18 ENCOUNTER — Emergency Department (HOSPITAL_COMMUNITY)
Admission: EM | Admit: 2012-07-18 | Discharge: 2012-07-19 | Disposition: A | Discharge status: Home / Self Care | Payer: Medicare Other | Attending: Emergency Medicine | Admitting: Emergency Medicine

## 2012-07-18 DIAGNOSIS — W19XXXA Unspecified fall, initial encounter: Secondary | ICD-10-CM

## 2012-07-18 DIAGNOSIS — Z8679 Personal history of other diseases of the circulatory system: Secondary | ICD-10-CM | POA: Insufficient documentation

## 2012-07-18 DIAGNOSIS — Z794 Long term (current) use of insulin: Secondary | ICD-10-CM | POA: Insufficient documentation

## 2012-07-18 DIAGNOSIS — E7800 Pure hypercholesterolemia, unspecified: Secondary | ICD-10-CM | POA: Insufficient documentation

## 2012-07-18 DIAGNOSIS — I10 Essential (primary) hypertension: Secondary | ICD-10-CM | POA: Insufficient documentation

## 2012-07-18 DIAGNOSIS — Y939 Activity, unspecified: Secondary | ICD-10-CM | POA: Insufficient documentation

## 2012-07-18 DIAGNOSIS — S0180XA Unspecified open wound of other part of head, initial encounter: Secondary | ICD-10-CM | POA: Insufficient documentation

## 2012-07-18 DIAGNOSIS — Z79899 Other long term (current) drug therapy: Secondary | ICD-10-CM | POA: Insufficient documentation

## 2012-07-18 DIAGNOSIS — Z23 Encounter for immunization: Secondary | ICD-10-CM | POA: Insufficient documentation

## 2012-07-18 DIAGNOSIS — Z8669 Personal history of other diseases of the nervous system and sense organs: Secondary | ICD-10-CM | POA: Insufficient documentation

## 2012-07-18 DIAGNOSIS — Y9210 Unspecified residential institution as the place of occurrence of the external cause: Secondary | ICD-10-CM | POA: Insufficient documentation

## 2012-07-18 DIAGNOSIS — Z87891 Personal history of nicotine dependence: Secondary | ICD-10-CM | POA: Insufficient documentation

## 2012-07-18 DIAGNOSIS — S0181XA Laceration without foreign body of other part of head, initial encounter: Secondary | ICD-10-CM

## 2012-07-18 DIAGNOSIS — W050XXA Fall from non-moving wheelchair, initial encounter: Secondary | ICD-10-CM | POA: Insufficient documentation

## 2012-07-18 DIAGNOSIS — E119 Type 2 diabetes mellitus without complications: Secondary | ICD-10-CM | POA: Insufficient documentation

## 2012-07-18 LAB — GLUCOSE, CAPILLARY: Glucose-Capillary: 114 mg/dL — ABNORMAL HIGH (ref 70–99)

## 2012-07-18 MED ORDER — TETANUS-DIPHTH-ACELL PERTUSSIS 5-2.5-18.5 LF-MCG/0.5 IM SUSP
0.5000 mL | Freq: Once | INTRAMUSCULAR | Status: AC
  Administered 2012-07-18: 0.5 mL via INTRAMUSCULAR
  Filled 2012-07-18: qty 0.5

## 2012-07-18 MED ORDER — LIDOCAINE HCL (PF) 1 % IJ SOLN
5.0000 mL | Freq: Once | INTRAMUSCULAR | Status: AC
  Administered 2012-07-18: 5 mL via INTRADERMAL
  Filled 2012-07-18: qty 5

## 2012-07-18 MED ORDER — BACITRACIN ZINC 500 UNIT/GM EX OINT
TOPICAL_OINTMENT | CUTANEOUS | Status: AC
  Administered 2012-07-18: 1
  Filled 2012-07-18: qty 1.8

## 2012-07-18 NOTE — ED Notes (Signed)
Pt is resident of [facility] for rehab s/p right hip fracture/repair.   Pt was sitting in a wc and states he thinks he fell asleep and then fell forward out of wc.  Pt denies worse pain to right hip, but has lac to forehead, lac to left elbow and pain to right hand.  Pt denies loc.

## 2012-07-18 NOTE — ED Provider Notes (Signed)
History  This chart was scribed for Garrett Hutching, MD by [name], ED Scribe. This patient was seen in room APA01/APA01 and the patient's care was started at 10:09 PM.  CSN: [id number]  Arrival date & time 07/18/12  2107   First MD Initiated Contact with Patient 07/18/12 2209      Chief Complaint  Patient presents with  . Fall    The history is provided by the patient. No language interpreter was used.    HPI Comments: Garrett [name] is a 79 y.o. male and a resident of the [facility] presents to the Emergency Department complaining of a fall out of his wheelchair PTA. Pt states that he was sitting in his wheelchair when he went to sleep and fell forward out of the chair. Pt is currently oat the [facility] for a right hip fracture. He denies any surgery and denies any worsening of pain to the right hip. He denies HA, neck pain and back pain as associated symptoms. Family present with the pt states that he is at his baseline. He has a h/o HTN, DM and a SDH after an episode of syncope in 2012.   PCP is Dr. Dwana Melena  Past Medical History  Diagnosis Date  . Diabetes mellitus without complication   . Hypertension   . Hypercholesterolemia   . SDH (subdural hematoma) 05/2010    due to fall  . Thoracic aortic aneurysm without rupture     Past Surgical History  Procedure Laterality Date  . Inguinal hernia repair Bilateral   . Hernia repair      No family history on file.  History  Substance Use Topics  . Smoking status: Former Games developer  . Smokeless tobacco: Not on file  . Alcohol Use: No      Review of Systems  A complete 10 system review of systems was obtained and all systems are negative except as noted in the HPI and PMH.    Allergies  Review of patient's allergies indicates no known allergies.  Home Medications   Current Outpatient Rx  Name  Route  Sig  Dispense  Refill  . amLODipine-benazepril (LOTREL) 5-20 MG per capsule   Oral   Take 1 capsule by  mouth daily.         . feeding supplement (PRO-STAT SUGAR FREE 64) LIQD   Oral   Take 30 mLs by mouth daily.         Marland Kitchen HYDROcodone-acetaminophen (NORCO/VICODIN) 5-325 MG per tablet      Take one tablet every 4 hours as needed for pain   180 tablet   5   . insulin glargine (LANTUS) 100 UNIT/ML injection   Subcutaneous   Inject 40 Units into the skin daily.          Marland Kitchen levofloxacin (LEVAQUIN) 750 MG tablet   Oral   Take 1 tablet (750 mg total) by mouth daily at 6 PM.   14 tablet   1   . metFORMIN (GLUCOPHAGE) 500 MG tablet   Oral   Take 500 mg by mouth 2 (two) times daily with a meal.         . rivaroxaban (XARELTO) 10 MG TABS tablet   Oral   Take 10 mg by mouth daily. To take for 35 days         . rosuvastatin (CRESTOR) 20 MG tablet   Oral   Take 20 mg by mouth daily.         Marland Kitchen  insulin aspart (NOVOLOG) 100 UNIT/ML injection   Subcutaneous   Inject 1-10 Units into the skin 3 (three) times daily with meals.           Triage Vitals: BP 154/73  Pulse 71  Temp(Src) 98.1 F (36.7 C) (Oral)  Resp 20  Ht 5\' 9"  (1.753 m)  Wt 180 lb (81.647 kg)  BMI 26.57 kg/m2  SpO2 97%  Physical Exam  Nursing note and vitals reviewed. Constitutional: He is oriented to person, place, and time. He appears well-developed and well-nourished.  HENT:  Head: Normocephalic.  2.7 cm vertical well-approximated laceartion to the right forehead  Eyes: Conjunctivae and EOM are normal. Pupils are equal, round, and reactive to light.  Neck: Normal range of motion. Neck supple.  Cardiovascular: Normal rate.   Pulmonary/Chest: Effort normal.  Abdominal: He exhibits no distension.  Musculoskeletal: Normal range of motion.  Neurological: He is alert and oriented to person, place, and time.  Skin: Skin is warm and dry.  Skin tear to the lateral aspect of the left elbow that is currently bandaged  Psychiatric: He has a normal mood and affect.    ED Course  Procedures (including  critical care time)  DIAGNOSTIC STUDIES: Oxygen Saturation is 97% on room air, normal by my interpretation.    COORDINATION OF CARE: 10:15 PM-Discussed treatment plan which includes laceration repair by my PA with pt and family at bedside and all agreed to plan.   Labs Reviewed - No data to display No results found.   No diagnosis found.    MDM  Accidental fall from wheelchair.   No loss of consciousness or neurological deficits. Minor  skin tear on left elbow.    Laceration repair on forehead per physician's assistant      I personally performed the services described in this documentation, which was scribed in my presence. The recorded information has been reviewed and is accurate.    Garrett Hutching, MD 07/18/12 2250

## 2012-07-18 NOTE — ED Provider Notes (Signed)
   Patient with laceration to the midline forehead secondary to a fall.  Patient was seen and treated by the EDP, Dr. Adriana Simas, and I was asked to suture the wound.  This was my only involvement in this patient's care.      LACERATION REPAIR Performed by: Linzie Boursiquot L. Authorized by: Maxwell Caul Consent: Verbal consent obtained. Risks and benefits: risks, benefits and alternatives were discussed Consent given by: patient Patient identity confirmed: provided demographic data Prepped and Draped in normal sterile fashion Wound explored  Laceration Location: midline forehead Laceration Length: 4 cm  No Foreign Bodies seen or palpated  Anesthesia: local infiltration  Local anesthetic: lidocaine 1 % w/o epinephrine  Anesthetic total: 3 ml  Irrigation method: syringe Amount of cleaning: standard  Skin closure: 6-0 prolene  Number of sutures: 8  Technique: simple interrupted  Patient tolerance: Patient tolerated the procedure well with no immediate complications.    Bleeding controlled, pain improved.  TDaP was given.  Remains alert, VSS.     Kymani Laursen L. Deondrea Aguado, PA-C 07/18/12 2352

## 2012-07-18 NOTE — ED Notes (Signed)
Family voicing concerns that pt has had multiple falls in the past several weeks and would like to discuss possible ways to keep pt safe.

## 2012-07-19 ENCOUNTER — Ambulatory Visit (HOSPITAL_COMMUNITY)
Admission: RE | Admit: 2012-07-19 | Discharge: 2012-07-19 | Disposition: A | Discharge status: Home / Self Care | Payer: Medicare Other | Source: Ambulatory Visit | Attending: Internal Medicine | Admitting: Internal Medicine

## 2012-07-19 ENCOUNTER — Inpatient Hospital Stay
Admission: RE | Admit: 2012-07-19 | Discharge: 2012-08-25 | Disposition: A | Discharge status: Other Acute Inpatient Hospital | Payer: BC Managed Care – PPO | Source: Ambulatory Visit | Attending: Internal Medicine | Admitting: Internal Medicine

## 2012-07-19 ENCOUNTER — Other Ambulatory Visit (HOSPITAL_BASED_OUTPATIENT_CLINIC_OR_DEPARTMENT_OTHER): Payer: Self-pay | Admitting: Internal Medicine

## 2012-07-19 ENCOUNTER — Telehealth: Payer: Self-pay | Admitting: Orthopedic Surgery

## 2012-07-19 DIAGNOSIS — T148XXA Other injury of unspecified body region, initial encounter: Principal | ICD-10-CM

## 2012-07-19 DIAGNOSIS — W19XXXA Unspecified fall, initial encounter: Secondary | ICD-10-CM | POA: Insufficient documentation

## 2012-07-19 DIAGNOSIS — S32509A Unspecified fracture of unspecified pubis, initial encounter for closed fracture: Secondary | ICD-10-CM | POA: Insufficient documentation

## 2012-07-19 DIAGNOSIS — S32409A Unspecified fracture of unspecified acetabulum, initial encounter for closed fracture: Secondary | ICD-10-CM | POA: Insufficient documentation

## 2012-07-19 LAB — GLUCOSE, CAPILLARY: Glucose-Capillary: 169 mg/dL — ABNORMAL HIGH (ref 70–99)

## 2012-07-19 NOTE — Telephone Encounter (Signed)
Call received from [facility], ph# [phone], relayed that patient had a new injury, fall, last night, and had new Xrays done at [facility].  Patient has a scheduled hospital follow-up appointment 08/16/12 (6-week re-check with Xrays).  Please review new Xrays and please advise.  Ph# [phone] for [facility] call back.

## 2012-07-20 ENCOUNTER — Non-Acute Institutional Stay (SKILLED_NURSING_FACILITY): Payer: Medicare Other | Admitting: Internal Medicine

## 2012-07-20 DIAGNOSIS — S32401D Unspecified fracture of right acetabulum, subsequent encounter for fracture with routine healing: Secondary | ICD-10-CM

## 2012-07-20 DIAGNOSIS — S72009D Fracture of unspecified part of neck of unspecified femur, subsequent encounter for closed fracture with routine healing: Secondary | ICD-10-CM

## 2012-07-20 DIAGNOSIS — R269 Unspecified abnormalities of gait and mobility: Secondary | ICD-10-CM

## 2012-07-20 DIAGNOSIS — R4182 Altered mental status, unspecified: Secondary | ICD-10-CM

## 2012-07-20 LAB — GLUCOSE, CAPILLARY
Glucose-Capillary: 165 mg/dL — ABNORMAL HIGH (ref 70–99)
Glucose-Capillary: 236 mg/dL — ABNORMAL HIGH (ref 70–99)

## 2012-07-20 NOTE — Progress Notes (Signed)
Patient ID: Garrett [name], male   DOB: Jun 09, 1933, 79 y.o.   MRN: [id number] COMPLAINT: Acute visit secondary to patient feels like he's "levitating"   HISTORY OF PRESENT ILLNESS: This patient apparently fell getting out of his lift-chair at home. He was ultimately diagnosed with a right acetabular fracture which was diagnosed by CT scan of the hip. He apparently did not want surgery and he was felt not to be a good surgical candidate. Apparently he fell over the weekend and he sustained a laceration to his forehead-this was stitched.  X-rays of the pelvis did not show any acute changes.  Apparently patient is complaining at times-feels- like he's levitating apparently says he occasionally has some blurred vision.--Says this has happened a couple times this morning---  he is sitting in the wheelchair -- says also he feels like he's rising out of it and then has some blurred vision There is no associated shortness of breath chest pain or abdominal discomfort  His vital signs have been stable he is afebrile mental status appears to be at baseline according to nursing staff   .   PAST MEDICAL HISTORY:  Type 2 diabetes.  Hypertension.  Hypercholesterolemia.  Subdural hematoma, apparently secondary to a fall in March 2012.  Thoracic aortic aneurysm without rupture.  PAST SURGICAL HISTORY:  Inguinal hernia repair bilaterally.  CURRENT MEDICATIONS:  Lantus insulin 40 U a day, a sliding scale as well as metformin.  Crestor 20 mg a day.  Amlodipine/benazepril 5/20 a day.  Xeralto 10 mg QD Crestor 20 mg daily Glucophage 500 mg twice a day .Marland Kitchen  SOCIAL HISTORY:  HOUSING: The patient lives in [place] on his own.  FUNCTIONAL STATUS: He has a lift-chair and a Richeson at home.? history of falls., I note the subdural hematoma two years ago .  REVIEW OF SYSTEMS Gen. no complaints of fever chills.  Eyes-says she has occasional blurred vision when he feels like he's levitating:  CHEST/RESPIRATORY:  He is not complaining of shortness of breath. Quit smoking 25 years ago--occasional cough.  CARDIAC: No chest pain.  GI: No nausea, vomiting, abdominal pain, or diarrhea.  GU: No incontinence or dysuria.  MUSCULOSKELETAL: He does not complain of pain.  Neurologic-denies any headache or dizziness just feels like he's levitating at times this last for about 30 seconds.   PHYSICAL EXAMINATION Temperature 98.6 pulse 71 respirations 20 blood pressure 122/74:--Recent blood pressures 134/80 139/73-98/65? --Taken manually today was 122/84  GENERAL APPEARANCE: The patient is in no distress. Sitting comfortably in his Wheelchair . Skin-does have a laceration midforehead this appears to be quite benign with no erythema or drainage stitches are in place HEENT:  MOUTH/THROAT: Lips without lesions. No lesions noted in mouth. Tongue is without lesions. Oropharynx without redness or lesions. Uvula elevates midline. Teeth are in good repair.  CHEST/RESPIRATORY: Air entry is equal and clear bilaterally.  CARDIOVASCULAR:  CARDIAC: Heart sounds are normal. No murmurs. No increase in jugular venous pressure.  GASTROINTESTINAL:  ABDOMEN: Soft. No masses Osterhaus.  LIVER/SPLEEN/KIDNEYS: No liver, no spleen Muscle skeletal-does have wrapping of his lower legs bilaterally and do not see any protruding erythema.  Is able to move all extremities at baseline strength appears to be intact he does have a skin tear on his left elbow.  Neurologic-cranial nerves intact pupils equal round reactive to light--but the movements intact.  Cranial nerves are intact-moves all extremities with baseline strength and range of motion could not really appreciate any focal neurologic findings.  Psych-he is  alert and oriented at his baseline pleasant and appropriate in good humor.  Marland Kitchen   l  07/19/2012.  WBC 13.6 hemoglobin 14.0 platelets 411.  Sodium 136 potassium 3.7 BUN 19 creatinine 0.87  .  ASSESSMENT/PLAN:  #1-change  in mental status-with recent history of fall and distant history of subdural hematoma-will check a CT of the head to rule out any recurrence here in light of his recent fall-neurologically he appears to be grossly intact.  Denies any associated shortness of breath chest pain or dizziness--- monitor vital signs and neuro checks every 2 hours x2 every 4 hours x2 and then every shift  #2-history of pelvic fracture-at this point appears to be doing well this does not appear to be an acute issue at this time-recent x-ray did not show any acute changes despite his fall.    .  [id number] .

## 2012-07-20 NOTE — Telephone Encounter (Signed)
Keep non weight bearing   Keep appt here as scheduled

## 2012-07-20 NOTE — Telephone Encounter (Signed)
Called back to [facility] this morning (10:05a.m) and spoke with patient's nurse, Del.  Relayed and faxed copy of Dr. Mort Sawyers note, to fax# [phone].

## 2012-07-21 ENCOUNTER — Ambulatory Visit (HOSPITAL_COMMUNITY)
Admit: 2012-07-21 | Discharge: 2012-07-21 | Disposition: A | Discharge status: Home / Self Care | Payer: Medicare Other | Attending: Internal Medicine | Admitting: Internal Medicine

## 2012-07-21 DIAGNOSIS — G9389 Other specified disorders of brain: Secondary | ICD-10-CM | POA: Insufficient documentation

## 2012-07-21 DIAGNOSIS — E785 Hyperlipidemia, unspecified: Secondary | ICD-10-CM | POA: Insufficient documentation

## 2012-07-21 DIAGNOSIS — E119 Type 2 diabetes mellitus without complications: Secondary | ICD-10-CM | POA: Insufficient documentation

## 2012-07-21 DIAGNOSIS — I10 Essential (primary) hypertension: Secondary | ICD-10-CM | POA: Insufficient documentation

## 2012-07-21 NOTE — ED Provider Notes (Signed)
Medical screening examination/treatment/procedure(s) were conducted as a shared visit with non-physician practitioner(s) and myself.  I personally evaluated the patient during the encounter.   Laceration repair per physician's assistant.   History and physical performed by physician  [name], MD 07/21/12 [phone]

## 2012-07-22 LAB — GLUCOSE, CAPILLARY
Glucose-Capillary: 102 mg/dL — ABNORMAL HIGH (ref 70–99)
Glucose-Capillary: 162 mg/dL — ABNORMAL HIGH (ref 70–99)
Glucose-Capillary: 193 mg/dL — ABNORMAL HIGH (ref 70–99)

## 2012-07-23 LAB — GLUCOSE, CAPILLARY
Glucose-Capillary: 120 mg/dL — ABNORMAL HIGH (ref 70–99)
Glucose-Capillary: 221 mg/dL — ABNORMAL HIGH (ref 70–99)
Glucose-Capillary: 230 mg/dL — ABNORMAL HIGH (ref 70–99)

## 2012-07-24 LAB — GLUCOSE, CAPILLARY
Glucose-Capillary: 90 mg/dL (ref 70–99)
Glucose-Capillary: 181 mg/dL — ABNORMAL HIGH (ref 70–99)

## 2012-07-25 LAB — GLUCOSE, CAPILLARY
Glucose-Capillary: 214 mg/dL — ABNORMAL HIGH (ref 70–99)
Glucose-Capillary: 220 mg/dL — ABNORMAL HIGH (ref 70–99)

## 2012-07-26 LAB — GLUCOSE, CAPILLARY
Glucose-Capillary: 169 mg/dL — ABNORMAL HIGH (ref 70–99)
Glucose-Capillary: 206 mg/dL — ABNORMAL HIGH (ref 70–99)

## 2012-07-27 LAB — GLUCOSE, CAPILLARY
Glucose-Capillary: 222 mg/dL — ABNORMAL HIGH (ref 70–99)
Glucose-Capillary: 256 mg/dL — ABNORMAL HIGH (ref 70–99)
Glucose-Capillary: 289 mg/dL — ABNORMAL HIGH (ref 70–99)

## 2012-07-28 ENCOUNTER — Non-Acute Institutional Stay (SKILLED_NURSING_FACILITY): Payer: Medicare Other | Admitting: Internal Medicine

## 2012-07-28 DIAGNOSIS — I8310 Varicose veins of unspecified lower extremity with inflammation: Secondary | ICD-10-CM

## 2012-07-28 LAB — GLUCOSE, CAPILLARY: Glucose-Capillary: 102 mg/dL — ABNORMAL HIGH (ref 70–99)

## 2012-07-29 LAB — GLUCOSE, CAPILLARY
Glucose-Capillary: 195 mg/dL — ABNORMAL HIGH (ref 70–99)
Glucose-Capillary: 341 mg/dL — ABNORMAL HIGH (ref 70–99)

## 2012-07-30 LAB — GLUCOSE, CAPILLARY
Glucose-Capillary: 120 mg/dL — ABNORMAL HIGH (ref 70–99)
Glucose-Capillary: 206 mg/dL — ABNORMAL HIGH (ref 70–99)
Glucose-Capillary: 276 mg/dL — ABNORMAL HIGH (ref 70–99)

## 2012-07-31 LAB — GLUCOSE, CAPILLARY
Glucose-Capillary: 117 mg/dL — ABNORMAL HIGH (ref 70–99)
Glucose-Capillary: 280 mg/dL — ABNORMAL HIGH (ref 70–99)

## 2012-08-01 LAB — GLUCOSE, CAPILLARY
Glucose-Capillary: 188 mg/dL — ABNORMAL HIGH (ref 70–99)
Glucose-Capillary: 281 mg/dL — ABNORMAL HIGH (ref 70–99)

## 2012-08-02 LAB — GLUCOSE, CAPILLARY
Glucose-Capillary: 165 mg/dL — ABNORMAL HIGH (ref 70–99)
Glucose-Capillary: 210 mg/dL — ABNORMAL HIGH (ref 70–99)
Glucose-Capillary: 203 mg/dL — ABNORMAL HIGH (ref 70–99)

## 2012-08-03 LAB — GLUCOSE, CAPILLARY: Glucose-Capillary: 208 mg/dL — ABNORMAL HIGH (ref 70–99)

## 2012-08-03 NOTE — Progress Notes (Signed)
Patient ID: DRAVON NOTT, male   DOB: 10-04-1933, 79 y.o.   MRN: [id number]           PROGRESS NOTE  DATE:  07/28/2012  FACILITY: [facility]   LEVEL OF CARE:   SNF   Acute Visit   CHIEF COMPLAINT:  Review of lower extremity venous stasis.    HISTORY OF PRESENT ILLNESS:  This is a gentleman who came after falling out of his lift chair at home.  He has a right acetabular fracture which was managed nonoperatively, followed by Orthopedics.    He has also recently been treated for cellulitis of the right lower extremity.  He has brawny erythema of both legs, but minimal edema.  I thought he probably had chronic venous stasis with erythema.  He had some superficial ulcers on which we have been using silver alginate.  These have closed over.    PHYSICAL EXAMINATION:   SKIN:  INSPECTION:  Lower extremities:  Both legs have no open areas.  There is still erythema and some edema, however,   ASSESSMENT/PLAN:  Chronic venous stasis.  I have changed him to TED hose.  We will see if this controls his swelling.  Cetaphil to keep his dermatitis lubricated and prevent any superficial openings.   He may need graded pressure stockings.  I will have to follow him as he approaches discharge.    CPT CODE: 66440

## 2012-08-04 LAB — GLUCOSE, CAPILLARY
Glucose-Capillary: 173 mg/dL — ABNORMAL HIGH (ref 70–99)
Glucose-Capillary: 197 mg/dL — ABNORMAL HIGH (ref 70–99)
Glucose-Capillary: 246 mg/dL — ABNORMAL HIGH (ref 70–99)

## 2012-08-05 LAB — GLUCOSE, CAPILLARY
Glucose-Capillary: 147 mg/dL — ABNORMAL HIGH (ref 70–99)
Glucose-Capillary: 195 mg/dL — ABNORMAL HIGH (ref 70–99)
Glucose-Capillary: 270 mg/dL — ABNORMAL HIGH (ref 70–99)
Glucose-Capillary: 261 mg/dL — ABNORMAL HIGH (ref 70–99)

## 2012-08-06 LAB — GLUCOSE, CAPILLARY: Glucose-Capillary: 288 mg/dL — ABNORMAL HIGH (ref 70–99)

## 2012-08-07 LAB — GLUCOSE, CAPILLARY
Glucose-Capillary: 125 mg/dL — ABNORMAL HIGH (ref 70–99)
Glucose-Capillary: 298 mg/dL — ABNORMAL HIGH (ref 70–99)

## 2012-08-08 LAB — GLUCOSE, CAPILLARY
Glucose-Capillary: 186 mg/dL — ABNORMAL HIGH (ref 70–99)
Glucose-Capillary: 267 mg/dL — ABNORMAL HIGH (ref 70–99)

## 2012-08-09 LAB — GLUCOSE, CAPILLARY: Glucose-Capillary: 194 mg/dL — ABNORMAL HIGH (ref 70–99)

## 2012-08-10 LAB — GLUCOSE, CAPILLARY
Glucose-Capillary: 320 mg/dL — ABNORMAL HIGH (ref 70–99)
Glucose-Capillary: 254 mg/dL — ABNORMAL HIGH (ref 70–99)

## 2012-08-11 LAB — GLUCOSE, CAPILLARY

## 2012-08-12 LAB — GLUCOSE, CAPILLARY
Glucose-Capillary: 199 mg/dL — ABNORMAL HIGH (ref 70–99)
Glucose-Capillary: 260 mg/dL — ABNORMAL HIGH (ref 70–99)

## 2012-08-13 LAB — GLUCOSE, CAPILLARY
Glucose-Capillary: 168 mg/dL — ABNORMAL HIGH (ref 70–99)
Glucose-Capillary: 323 mg/dL — ABNORMAL HIGH (ref 70–99)

## 2012-08-14 LAB — GLUCOSE, CAPILLARY: Glucose-Capillary: 286 mg/dL — ABNORMAL HIGH (ref 70–99)

## 2012-08-15 LAB — GLUCOSE, CAPILLARY: Glucose-Capillary: 253 mg/dL — ABNORMAL HIGH (ref 70–99)

## 2012-08-16 ENCOUNTER — Inpatient Hospital Stay (INDEPENDENT_AMBULATORY_CARE_PROVIDER_SITE_OTHER): Payer: Medicare Other

## 2012-08-16 ENCOUNTER — Ambulatory Visit (INDEPENDENT_AMBULATORY_CARE_PROVIDER_SITE_OTHER): Payer: Medicare Other | Admitting: Orthopedic Surgery

## 2012-08-16 VITALS — BP 110/60 | Ht 69.0 in | Wt 183.0 lb

## 2012-08-16 DIAGNOSIS — S32401D Unspecified fracture of right acetabulum, subsequent encounter for fracture with routine healing: Secondary | ICD-10-CM

## 2012-08-16 DIAGNOSIS — S72009D Fracture of unspecified part of neck of unspecified femur, subsequent encounter for closed fracture with routine healing: Secondary | ICD-10-CM

## 2012-08-16 LAB — GLUCOSE, CAPILLARY
Glucose-Capillary: 248 mg/dL — ABNORMAL HIGH (ref 70–99)
Glucose-Capillary: 174 mg/dL — ABNORMAL HIGH (ref 70–99)

## 2012-08-16 NOTE — Progress Notes (Signed)
Patient ID: Garrett [name], male   DOB: June 02, 1933, 79 y.o.   MRN: [id number] Chief Complaint  Patient presents with  . Follow-up    6 week follow up right hip    Garrett [name] right hip acetabular fracture 07/07/2012 is kept nonweightbearing is a nursing Center  Comes in for x-ray which shows that he does not have any displacement of his fracture  We can start progressive weightbearing 50% x2 weeks 75% x2 weeks and then full weightbearing.  Repeat x-ray in 6 weeks

## 2012-08-17 ENCOUNTER — Non-Acute Institutional Stay (SKILLED_NURSING_FACILITY): Payer: Medicare Other | Admitting: Internal Medicine

## 2012-08-17 DIAGNOSIS — S32401D Unspecified fracture of right acetabulum, subsequent encounter for fracture with routine healing: Secondary | ICD-10-CM

## 2012-08-17 DIAGNOSIS — L03119 Cellulitis of unspecified part of limb: Secondary | ICD-10-CM

## 2012-08-17 DIAGNOSIS — L03116 Cellulitis of left lower limb: Secondary | ICD-10-CM

## 2012-08-17 DIAGNOSIS — D72829 Elevated white blood cell count, unspecified: Secondary | ICD-10-CM

## 2012-08-17 DIAGNOSIS — E785 Hyperlipidemia, unspecified: Secondary | ICD-10-CM

## 2012-08-17 DIAGNOSIS — L02419 Cutaneous abscess of limb, unspecified: Secondary | ICD-10-CM

## 2012-08-17 DIAGNOSIS — S72009D Fracture of unspecified part of neck of unspecified femur, subsequent encounter for closed fracture with routine healing: Secondary | ICD-10-CM

## 2012-08-17 DIAGNOSIS — E119 Type 2 diabetes mellitus without complications: Secondary | ICD-10-CM

## 2012-08-17 LAB — GLUCOSE, CAPILLARY
Glucose-Capillary: 191 mg/dL — ABNORMAL HIGH (ref 70–99)
Glucose-Capillary: 292 mg/dL — ABNORMAL HIGH (ref 70–99)

## 2012-08-17 NOTE — Progress Notes (Signed)
Patient ID: Garrett [name], male   DOB: 04-30-1933, 79 y.o.   MRN: [id number]  This is an acute visit-routine visit.  Facility [facility].  Level of care skilled.   CHIEF COMPLAINT:  Management of right acetabular fracture-type 2 diabetes-hypertension-hyperlipidemia-subdural hematoma-acute visit secondary to right leg erythema   HISTORY OF PRESENT ILLNESS:  This patient apparently fell getting out of his lift-chair at home.  He was ultimately diagnosed with a right acetabular fracture which was diagnosed by CT scan of the hip.  He apparently did not want surgery and he was felt not to be a good surgical candidate.  He did see orthopedics yesterday and was cleared for 50% nonweightbearing for 2 weeks and then can progress from there.  Most acute issue today is some increased erythema of his lower legs-he does have a history of possible cellulitis here had been treated with Levaquin in the past he does have a history of significant venous stasis as well    He is afebrile  His other issues appear to be stable blood sugars run in the low to mid 100s earlier in the day-into the 200s later in the day-he is on Glucophage. As well as Lantus  For hypertension he is on Lotrel recent blood pressure somewhat variable 157/91--112/65-I do not see consistent elevations.      Marland Kitchen      PAST MEDICAL HISTORY: Type 2 diabetes.    Hypertension.    Hypercholesterolemia.   Subdural hematoma, apparently secondary to a fall in March 2012.   Thoracic aortic aneurysm without rupture.    PAST SURGICAL HISTORY: Inguinal hernia repair bilaterally.     CURRENT MEDICATIONS--reviewed per [facility] include Lotrel Glucophage Crestor-he has just been restarted on Levaquin secondary to possible leg cellulitis.  Also on Lantus 40 units every morning:    .     SOCIAL HISTORY: HOUSING:  The patient lives in [place] on his own.    FUNCTIONAL STATUS:  He has a lift-chair and a Flott at home.  says he does not have a  history of falls.  However, note the subdural hematoma two years ago.   REVIEW OF SYSTEMS Gen. denies any fevers chills Skin - as noted history of present illness is not really complaining of any pain or discomfort with the erythema in his legs:   CHEST/RESPIRATORY:  He is not complaining of shortness of breath.  Quit smoking 25 years ago.    CARDIAC:   No chest pain.   GI:  No nausea, vomiting, abdominal pain, or diarrhea.    GU:  No incontinence or dysuria.    MUSCULOSKELETAL:  He does not complain of a lot of pain. Apparently does not even ask for pain medicine during therapy  Neurologic -- did not complain of headache dizziness or numbness Cytomax denies issues continues to be pleasant and appropriate.  Marland Kitchen     PHYSICAL EXAMINATION: Temperature 97.6 pulse 65 respirations 20 blood pressure is as noted in the HPI   GENERAL APPEARANCE:  The patient is in no distress. Skin-is warm and dry he does have areas of erythema anterior shins bilaterally he also has skin tears which are currently covered one on the front of his lower right leg and the other left posterior lower leg-per wound  care these do not appear to be the source of any infection.  quite benign    HEENT:   MOUTH/THROAT:  Lips without lesions.  No lesions noted in mouth.  Tongue is without lesions.  Oropharynx  without redness or lesions.  Uvula elevates midline.  Teeth are in good repair.   CHEST/RESPIRATORY:  Air entry is equal and clear bilaterally.   CARDIOVASCULAR:   CARDIAC:   Heart sounds are normal.  No murmurs.  No increase in jugular venous pressure.    GASTROINTESTINAL:   ABDOMEN:   Soft.  No masses. Slightly hypoactive bowel sounds    Muscle skeletal-I do not note any deformitie-ambulates in wheelchair has just been cleared for weightbearing partially by orthopedics Neurologic-appears grossly intact cranial nerves intact speech is clear.  Psych he is alert and oriented x3 pleasant and  appropriate   Labs.  07/19/2012.  WBC 13.6 hemoglobin 14.0 platelets 411.  Sodium 136 potassium 3.7 BUN 19 creatinine 0.87     07/08/2012.  Liver function tests within normal limits except albumin of 2.9   ASSESSMENT/PLAN #1: Fall with right acetabular fracture.  He is being managed nonoperatively with non-weightbearing.  He has been cleared for partial weightbearing as noted above-Dr. Romeo Apple did see him yesterday-this appears to be fairly unremarkable he does not appear he requires his pain medicine much  #2.    Erythema in the lower legs, left greater than right, together with lower extremity wounds which are superficial.    We have restarted him on Levaquin-although  venous stasis may be  contributing to this--Will continue to monitor this has not progressed from yesterday it appears .    Type 2 diabetes.  Continues on Lantus and Glucophage-sugars in the morning are satisfactory somewhat elevated later in the day--he is on sliding scale-Will monitor for now to see if consistency  elevated later in the day.  #3-hyperlipidemia-continues on Crestor-Will defer aggressive workup of lipids to primary care provider since his stay here is likely to be fairly short.     Hypertension.  Medications as noted above. I do not note consistent elevation--will update BMP   #5-leukocytosis on lab about a month ago-update this --  [id number]  --of note 30 minutes spent assessing patient-reviewing records-and formulating a plan of care

## 2012-08-18 LAB — GLUCOSE, CAPILLARY
Glucose-Capillary: 263 mg/dL — ABNORMAL HIGH (ref 70–99)
Glucose-Capillary: 246 mg/dL — ABNORMAL HIGH (ref 70–99)

## 2012-08-19 LAB — GLUCOSE, CAPILLARY
Glucose-Capillary: 163 mg/dL — ABNORMAL HIGH (ref 70–99)
Glucose-Capillary: 220 mg/dL — ABNORMAL HIGH (ref 70–99)
Glucose-Capillary: 305 mg/dL — ABNORMAL HIGH (ref 70–99)

## 2012-08-20 LAB — GLUCOSE, CAPILLARY: Glucose-Capillary: 210 mg/dL — ABNORMAL HIGH (ref 70–99)

## 2012-08-21 ENCOUNTER — Non-Acute Institutional Stay (SKILLED_NURSING_FACILITY): Payer: Medicare Other | Admitting: Internal Medicine

## 2012-08-21 DIAGNOSIS — I8310 Varicose veins of unspecified lower extremity with inflammation: Secondary | ICD-10-CM

## 2012-08-21 DIAGNOSIS — L03116 Cellulitis of left lower limb: Secondary | ICD-10-CM

## 2012-08-21 DIAGNOSIS — L02419 Cutaneous abscess of limb, unspecified: Secondary | ICD-10-CM

## 2012-08-21 LAB — GLUCOSE, CAPILLARY
Glucose-Capillary: 82 mg/dL (ref 70–99)
Glucose-Capillary: 167 mg/dL — ABNORMAL HIGH (ref 70–99)
Glucose-Capillary: 169 mg/dL — ABNORMAL HIGH (ref 70–99)

## 2012-08-22 LAB — GLUCOSE, CAPILLARY
Glucose-Capillary: 183 mg/dL — ABNORMAL HIGH (ref 70–99)
Glucose-Capillary: 274 mg/dL — ABNORMAL HIGH (ref 70–99)

## 2012-08-23 LAB — GLUCOSE, CAPILLARY

## 2012-08-24 LAB — GLUCOSE, CAPILLARY: Glucose-Capillary: 209 mg/dL — ABNORMAL HIGH (ref 70–99)

## 2012-08-25 ENCOUNTER — Encounter (HOSPITAL_COMMUNITY): Payer: Self-pay | Admitting: *Deleted

## 2012-08-25 ENCOUNTER — Inpatient Hospital Stay (HOSPITAL_COMMUNITY): Payer: Medicare Other | Admitting: Anesthesiology

## 2012-08-25 ENCOUNTER — Encounter (HOSPITAL_COMMUNITY): Payer: Self-pay | Admitting: Anesthesiology

## 2012-08-25 ENCOUNTER — Inpatient Hospital Stay (HOSPITAL_COMMUNITY): Payer: Medicare Other

## 2012-08-25 ENCOUNTER — Emergency Department (HOSPITAL_COMMUNITY): Payer: Medicare Other

## 2012-08-25 ENCOUNTER — Inpatient Hospital Stay (HOSPITAL_COMMUNITY)
Admission: EM | Admit: 2012-08-25 | Discharge: 2012-09-14 | DRG: 061 | Disposition: E | Payer: Medicare Other | Attending: Neurology | Admitting: Neurology

## 2012-08-25 DIAGNOSIS — G8190 Hemiplegia, unspecified affecting unspecified side: Secondary | ICD-10-CM | POA: Diagnosis present

## 2012-08-25 DIAGNOSIS — I639 Cerebral infarction, unspecified: Secondary | ICD-10-CM

## 2012-08-25 DIAGNOSIS — Z515 Encounter for palliative care: Secondary | ICD-10-CM

## 2012-08-25 DIAGNOSIS — I872 Venous insufficiency (chronic) (peripheral): Secondary | ICD-10-CM | POA: Diagnosis present

## 2012-08-25 DIAGNOSIS — J690 Pneumonitis due to inhalation of food and vomit: Secondary | ICD-10-CM | POA: Diagnosis not present

## 2012-08-25 DIAGNOSIS — S81009A Unspecified open wound, unspecified knee, initial encounter: Secondary | ICD-10-CM

## 2012-08-25 DIAGNOSIS — I6350 Cerebral infarction due to unspecified occlusion or stenosis of unspecified cerebral artery: Secondary | ICD-10-CM

## 2012-08-25 DIAGNOSIS — I609 Nontraumatic subarachnoid hemorrhage, unspecified: Secondary | ICD-10-CM | POA: Diagnosis present

## 2012-08-25 DIAGNOSIS — R509 Fever, unspecified: Secondary | ICD-10-CM

## 2012-08-25 DIAGNOSIS — E7800 Pure hypercholesterolemia, unspecified: Secondary | ICD-10-CM | POA: Diagnosis present

## 2012-08-25 DIAGNOSIS — Z66 Do not resuscitate: Secondary | ICD-10-CM | POA: Diagnosis present

## 2012-08-25 DIAGNOSIS — S81802A Unspecified open wound, left lower leg, initial encounter: Secondary | ICD-10-CM

## 2012-08-25 DIAGNOSIS — E876 Hypokalemia: Secondary | ICD-10-CM | POA: Diagnosis present

## 2012-08-25 DIAGNOSIS — I712 Thoracic aortic aneurysm, without rupture, unspecified: Secondary | ICD-10-CM | POA: Diagnosis present

## 2012-08-25 DIAGNOSIS — Z794 Long term (current) use of insulin: Secondary | ICD-10-CM

## 2012-08-25 DIAGNOSIS — E785 Hyperlipidemia, unspecified: Secondary | ICD-10-CM | POA: Diagnosis present

## 2012-08-25 DIAGNOSIS — L97909 Non-pressure chronic ulcer of unspecified part of unspecified lower leg with unspecified severity: Secondary | ICD-10-CM | POA: Diagnosis present

## 2012-08-25 DIAGNOSIS — R269 Unspecified abnormalities of gait and mobility: Secondary | ICD-10-CM

## 2012-08-25 DIAGNOSIS — I63239 Cerebral infarction due to unspecified occlusion or stenosis of unspecified carotid arteries: Principal | ICD-10-CM | POA: Diagnosis present

## 2012-08-25 DIAGNOSIS — I619 Nontraumatic intracerebral hemorrhage, unspecified: Secondary | ICD-10-CM

## 2012-08-25 DIAGNOSIS — G936 Cerebral edema: Secondary | ICD-10-CM | POA: Diagnosis present

## 2012-08-25 DIAGNOSIS — I10 Essential (primary) hypertension: Secondary | ICD-10-CM | POA: Diagnosis present

## 2012-08-25 DIAGNOSIS — Z87891 Personal history of nicotine dependence: Secondary | ICD-10-CM

## 2012-08-25 DIAGNOSIS — Z87820 Personal history of traumatic brain injury: Secondary | ICD-10-CM

## 2012-08-25 DIAGNOSIS — J95821 Acute postprocedural respiratory failure: Secondary | ICD-10-CM | POA: Diagnosis present

## 2012-08-25 DIAGNOSIS — L03116 Cellulitis of left lower limb: Secondary | ICD-10-CM | POA: Diagnosis present

## 2012-08-25 DIAGNOSIS — E119 Type 2 diabetes mellitus without complications: Secondary | ICD-10-CM | POA: Diagnosis present

## 2012-08-25 LAB — RAPID URINE DRUG SCREEN, HOSP PERFORMED
Amphetamines: NOT DETECTED
Barbiturates: NOT DETECTED
Benzodiazepines: NOT DETECTED
Cocaine: NOT DETECTED
Opiates: NOT DETECTED
Tetrahydrocannabinol: NOT DETECTED

## 2012-08-25 LAB — POCT I-STAT, CHEM 8
BUN: 16 mg/dL (ref 6–23)
BUN: 16 mg/dL (ref 6–23)
Calcium, Ion: 1.15 mmol/L (ref 1.13–1.30)
Calcium, Ion: 1.16 mmol/L (ref 1.13–1.30)
Chloride: 104 mEq/L (ref 96–112)
Chloride: 105 mEq/L (ref 96–112)
Creatinine, Ser: 0.9 mg/dL (ref 0.50–1.35)
Creatinine, Ser: 0.9 mg/dL (ref 0.50–1.35)
Glucose, Bld: 224 mg/dL — ABNORMAL HIGH (ref 70–99)
Glucose, Bld: 225 mg/dL — ABNORMAL HIGH (ref 70–99)
HCT: 42 % (ref 39.0–52.0)
HCT: 45 % (ref 39.0–52.0)
Hemoglobin: 14.3 g/dL (ref 13.0–17.0)
Hemoglobin: 15.3 g/dL (ref 13.0–17.0)
Potassium: 4.2 mEq/L (ref 3.5–5.1)
Potassium: 4.3 mEq/L (ref 3.5–5.1)
Sodium: 138 mEq/L (ref 135–145)
Sodium: 140 mEq/L (ref 135–145)
TCO2: 30 mmol/L (ref 0–100)
TCO2: 30 mmol/L (ref 0–100)

## 2012-08-25 LAB — COMPREHENSIVE METABOLIC PANEL
ALT: 13 U/L (ref 0–53)
AST: 11 U/L (ref 0–37)
Albumin: 3.4 g/dL — ABNORMAL LOW (ref 3.5–5.2)
Alkaline Phosphatase: 106 U/L (ref 39–117)
BUN: 15 mg/dL (ref 6–23)
CO2: 29 mEq/L (ref 19–32)
Calcium: 9.5 mg/dL (ref 8.4–10.5)
Chloride: 102 mEq/L (ref 96–112)
Creatinine, Ser: 0.76 mg/dL (ref 0.50–1.35)
GFR calc Af Amer: >90 mL/min (ref >90)
GFR calc non Af Amer: 86 mL/min — ABNORMAL LOW (ref >90)
Glucose, Bld: 226 mg/dL — ABNORMAL HIGH (ref 70–99)
Potassium: 3.9 mEq/L (ref 3.5–5.1)
Sodium: 140 mEq/L (ref 135–145)
Total Bilirubin: 0.2 mg/dL — ABNORMAL LOW (ref 0.3–1.2)
Total Protein: 6.7 g/dL (ref 6.0–8.3)

## 2012-08-25 LAB — CBC
HCT: 41.3 % (ref 39.0–52.0)
Hemoglobin: 14.2 g/dL (ref 13.0–17.0)
MCH: 30.2 pg (ref 26.0–34.0)
MCHC: 34.4 g/dL (ref 30.0–36.0)
MCV: 87.9 fL (ref 78.0–100.0)
Platelets: 268 10*3/uL (ref 150–400)
RBC: 4.7 MIL/uL (ref 4.22–5.81)
RDW: 12.9 % (ref 11.5–15.5)
WBC: 10.1 10*3/uL (ref 4.0–10.5)

## 2012-08-25 LAB — DIFFERENTIAL
Basophils Absolute: 0 10*3/uL (ref 0.0–0.1)
Basophils Relative: 0 % (ref 0–1)
Eosinophils Absolute: 0.1 10*3/uL (ref 0.0–0.7)
Eosinophils Relative: 1 % (ref 0–5)
Lymphocytes Relative: 14 % (ref 12–46)
Lymphs Abs: 1.4 10*3/uL (ref 0.7–4.0)
Monocytes Absolute: 0.7 10*3/uL (ref 0.1–1.0)
Monocytes Relative: 7 % (ref 3–12)
Neutro Abs: 7.9 10*3/uL — ABNORMAL HIGH (ref 1.7–7.7)
Neutrophils Relative %: 78 % — ABNORMAL HIGH (ref 43–77)

## 2012-08-25 LAB — URINE MICROSCOPIC-ADD ON

## 2012-08-25 LAB — GLUCOSE, CAPILLARY
Glucose-Capillary: 146 mg/dL — ABNORMAL HIGH (ref 70–99)
Glucose-Capillary: 215 mg/dL — ABNORMAL HIGH (ref 70–99)

## 2012-08-25 LAB — POCT I-STAT TROPONIN I
Troponin i, poc: 0 ng/mL (ref 0.00–0.08)
Troponin i, poc: 0 ng/mL (ref 0.00–0.08)

## 2012-08-25 LAB — URINALYSIS, ROUTINE W REFLEX MICROSCOPIC
Bilirubin Urine: NEGATIVE
Glucose, UA: >1000 mg/dL — AB
Hgb urine dipstick: NEGATIVE
Ketones, ur: NEGATIVE mg/dL
Leukocytes, UA: NEGATIVE
Nitrite: NEGATIVE
Protein, ur: NEGATIVE mg/dL
Specific Gravity, Urine: 1.015 (ref 1.005–1.030)
Urobilinogen, UA: 0.2 mg/dL (ref 0.0–1.0)
pH: 6 (ref 5.0–8.0)

## 2012-08-25 LAB — ETHANOL: Alcohol, Ethyl (B): <11 mg/dL (ref 0–11)

## 2012-08-25 LAB — PROTIME-INR
INR: 0.94 (ref 0.00–1.49)
Prothrombin Time: 12.5 seconds (ref 11.6–15.2)

## 2012-08-25 LAB — TROPONIN I: Troponin I: <0.3 ng/mL (ref <0.30)

## 2012-08-25 LAB — APTT: aPTT: 28 seconds (ref 24–37)

## 2012-08-25 MED ORDER — ALTEPLASE (STROKE) FULL DOSE INFUSION
0.9000 mg/kg | Freq: Once | INTRAVENOUS | Status: AC
  Administered 2012-08-25: 75 mg via INTRAVENOUS
  Filled 2012-08-25: qty 75

## 2012-08-25 MED ORDER — FENTANYL CITRATE 0.05 MG/ML IJ SOLN
INTRAMUSCULAR | Status: AC | PRN
  Administered 2012-08-25: 25 ug via INTRAVENOUS

## 2012-08-25 MED ORDER — PROPOFOL 10 MG/ML IV EMUL
5.0000 ug/kg/min | INTRAVENOUS | Status: DC
  Administered 2012-08-25: 10 ug/kg/min via INTRAVENOUS
  Administered 2012-08-26 – 2012-08-28 (×9): 30 ug/kg/min via INTRAVENOUS
  Administered 2012-08-28 – 2012-08-29 (×2): 15 ug/kg/min via INTRAVENOUS
  Administered 2012-08-29: 18 ug/kg/min via INTRAVENOUS
  Administered 2012-08-30: 20 ug/kg/min via INTRAVENOUS
  Filled 2012-08-25 (×15): qty 100

## 2012-08-25 MED ORDER — INSULIN ASPART 100 UNIT/ML ~~LOC~~ SOLN
2.0000 [IU] | SUBCUTANEOUS | Status: DC
  Administered 2012-08-26: 4 [IU] via SUBCUTANEOUS

## 2012-08-25 MED ORDER — LABETALOL HCL 5 MG/ML IV SOLN: 10.0000 mg | INTRAVENOUS | Status: DC | PRN

## 2012-08-25 MED ORDER — PANTOPRAZOLE SODIUM 40 MG IV SOLR: 40.0000 mg | Freq: Every day | INTRAVENOUS | Status: DC

## 2012-08-25 MED ORDER — INSULIN ASPART 100 UNIT/ML ~~LOC~~ SOLN: 0.0000 [IU] | SUBCUTANEOUS | Status: DC

## 2012-08-25 MED ORDER — SUCCINYLCHOLINE CHLORIDE 20 MG/ML IJ SOLN
INTRAMUSCULAR | Status: DC | PRN
  Administered 2012-08-25: 120 mg via INTRAVENOUS

## 2012-08-25 MED ORDER — ROCURONIUM BROMIDE 100 MG/10ML IV SOLN
INTRAVENOUS | Status: DC | PRN
  Administered 2012-08-25: 20 mg via INTRAVENOUS

## 2012-08-25 MED ORDER — SODIUM CHLORIDE 0.9 % IV SOLN
INTRAVENOUS | Status: DC | PRN
  Administered 2012-08-25: 22:00:00 via INTRAVENOUS

## 2012-08-25 MED ORDER — ACETAMINOPHEN 325 MG PO TABS: 650.0000 mg | ORAL_TABLET | ORAL | Status: DC | PRN

## 2012-08-25 MED ORDER — SODIUM CHLORIDE 0.9 % IV SOLN: INTRAVENOUS | Status: DC

## 2012-08-25 MED ORDER — FENTANYL CITRATE 0.05 MG/ML IJ SOLN
25.0000 ug | INTRAMUSCULAR | Status: DC | PRN
  Administered 2012-08-25 – 2012-08-26 (×4): 50 ug via INTRAVENOUS
  Filled 2012-08-25 (×3): qty 2

## 2012-08-25 MED ORDER — IOHEXOL 300 MG/ML  SOLN
150.0000 mL | Freq: Once | INTRAMUSCULAR | Status: AC | PRN
  Administered 2012-08-25: 80 mL via INTRAVENOUS

## 2012-08-25 MED ORDER — CHLORHEXIDINE GLUCONATE 0.12 % MT SOLN
15.0000 mL | Freq: Two times a day (BID) | OROMUCOSAL | Status: DC
  Administered 2012-08-26 – 2012-08-30 (×10): 15 mL via OROMUCOSAL
  Filled 2012-08-25 (×11): qty 15

## 2012-08-25 MED ORDER — ALTEPLASE 100 MG IV SOLR
INTRAVENOUS | Status: AC
  Administered 2012-08-25: 7.5 mg
  Filled 2012-08-25: qty 1

## 2012-08-25 MED ORDER — SODIUM CHLORIDE 0.9 % IV SOLN
INTRAVENOUS | Status: DC
  Administered 2012-08-25 – 2012-08-29 (×6): via INTRAVENOUS

## 2012-08-25 MED ORDER — PROPOFOL 10 MG/ML IV BOLUS
INTRAVENOUS | Status: DC | PRN
  Administered 2012-08-25: 30 mg via INTRAVENOUS
  Administered 2012-08-25: 130 mg via INTRAVENOUS

## 2012-08-25 MED ORDER — LIDOCAINE HCL (CARDIAC) 20 MG/ML IV SOLN
INTRAVENOUS | Status: DC | PRN
  Administered 2012-08-25: 80 mg via INTRAVENOUS

## 2012-08-25 MED ORDER — LABETALOL HCL 5 MG/ML IV SOLN
20.0000 mg | INTRAVENOUS | Status: DC | PRN
  Administered 2012-08-25: 20 mg via INTRAVENOUS
  Filled 2012-08-25: qty 4

## 2012-08-25 MED ORDER — BIOTENE DRY MOUTH MT LIQD
15.0000 mL | Freq: Four times a day (QID) | OROMUCOSAL | Status: DC
  Administered 2012-08-25 – 2012-08-30 (×19): 15 mL via OROMUCOSAL

## 2012-08-25 MED ORDER — SODIUM CHLORIDE 0.9 % IV SOLN
250.0000 mL | Freq: Once | INTRAVENOUS | Status: AC
  Administered 2012-08-25: 250 mL via INTRAVENOUS

## 2012-08-25 MED ORDER — PROPOFOL 10 MG/ML IV EMUL
INTRAVENOUS | Status: AC
  Filled 2012-08-25: qty 100

## 2012-08-25 MED ORDER — PANTOPRAZOLE SODIUM 40 MG IV SOLR
40.0000 mg | Freq: Every day | INTRAVENOUS | Status: DC
  Administered 2012-08-26: 40 mg via INTRAVENOUS
  Filled 2012-08-25 (×2): qty 40

## 2012-08-25 MED ORDER — MIDAZOLAM HCL 2 MG/2ML IJ SOLN
INTRAMUSCULAR | Status: AC | PRN
  Administered 2012-08-25 (×2): 1 mg via INTRAVENOUS

## 2012-08-25 MED ORDER — ACETAMINOPHEN 650 MG RE SUPP
650.0000 mg | RECTAL | Status: DC | PRN
  Administered 2012-08-26 – 2012-08-27 (×4): 650 mg via RECTAL
  Filled 2012-08-25 (×4): qty 1

## 2012-08-25 NOTE — H&P (Addendum)
Neurology History and Physical  CC: Left sided weakness  History is obtained from:Daughter, Medical record  HPI: Garrett [name] is a 79 y.o. male who was last seen normal at 4 PM and then was found around 5pm and presented to [facility]. Telestroke was activated, but after a delay, the patient was discussed with me over the phone and the decision was made to give tPA with the 4.5 hour window.   He was transferred to [facility] for further evaluation and possible intervention. He has previously stated that in the event of a cardiac arrest, he would not want resuscitation but has specifying that he would want intubation for procedures. After discussing with his daughter, the decision was made to go ahead with intervention and he was taken to IR.  He was in a facility undergoing rehab for hip surgery(performed 2 months ago)   LKW: 4pm tpa given: yes NIHSS: 20 MRS: 2(estimate)  Past Medical History  Diagnosis Date  . Diabetes mellitus without complication   . Hypertension   . Hypercholesterolemia   . SDH (subdural hematoma) 05/2010    due to fall  . Thoracic aortic aneurysm without rupture     Family History: Unable to assess secondary to patient's altered mental status.   Exam: Current vital signs: BP 150/93  Pulse 104  Temp(Src) 99.1 F (37.3 C) (Rectal)  Resp 23  Wt 83.008 kg (183 lb)  BMI 27.01 kg/m2  SpO2 100% Vital signs in last 24 hours: Temp:  [99.1 F (37.3 C)] 99.1 F (37.3 C) (06/11 1912) Pulse Rate:  [85-112] 104 (06/11 2145) Resp:  [17-24] 23 (06/11 2015) BP: (125-181)/(74-103) 150/93 mmHg (06/11 2145) SpO2:  [95 %-100 %] 100 % (06/11 2145) Weight:  [83.008 kg (183 lb)] 83.008 kg (183 lb) (06/11 1953)  General: in bed, appears agitated CV: RRR Mental Status: Awake, does not clearly follow commands reliably though does squeeze fingers on right.  Cranial Nerves: II: Does not blink to threat from left Pupils are equal, round, and reactive  to light.   III,IV, VI: Right gaze preference, does not cross midline without OCR.  V: Facial sensation is difficult to assess VII: Facial droop on left VIII, X, XI, XII: Unable to assess secondary to patient's altered mental status.  Motor: Tone is increased on left leg, decreased in arm. Bulk is normal. 5/5 strength was present on right, left arm is flacid, left leg flexes to nox stim.  Sensory: Responds to nox stim x4, less on left Plantars: Upgoing on left Cerebellar: Unable to assess secondary to patient's altered mental status.  Gait: Unable to assess secondary to patient's altered mental status.    EKG: sinus  I have reviewed labs in epic and the results pertinent to this consultation are: Elevated glucose nml INR I have reviewed the images obtained:CT - Previous TBI  Impression: 79 yo M with signs/symptoms of R MCA infarct. His difficulty with commands is unusual, and I wonder about multiple emboli. He has undergone IR procedure which demonstrated distal clot unable to be retrieved with emch thrombectomy and was out of window for IA tPA. He is being admitted to the ICU.   Recommendations: 1. HgbA1c, fasting lipid panel 2. MRI, MRA  of the brain without contrast 3. Frequent neuro checks 4. Echocardiogram 5. Carotid dopplers 6. Prophylactic therapy-None 7. Risk factor modification 8. Telemetry monitoring 9. PT consult, OT consult, Speech consult 10. SSI 11. CCM consulted, appreciate assistance.    This patient is  critically ill and at significant risk of neurological worsening, death and care requires constant monitoring of vital signs, hemodynamics,respiratory and cardiac monitoring, neurological assessment, discussion with family, other specialists and medical decision making of high complexity. I spent 35 minutes of neurocritical care time  in the care of  this patient.  Ritta Slot, MD [facility] [phone]  If 7pm- 7am, please page  neurology on call at [phone].  08/30/2012  10:54 PM

## 2012-08-25 NOTE — ED Provider Notes (Signed)
History  This chart was scribed for Garrett Razor, MD by Ardelia Mems, ED Scribe. This patient was seen in room APA07/APA07 and the patient's care was started at 6:41PM.   CSN: [id number]  Arrival date & time 08/19/2012  1820     Chief Complaint  Patient presents with  . Altered Mental Status     The history is provided by a relative. The history is limited by the condition of the patient. No language interpreter was used.     HPI Comments: KEITA VALLEY is a 79 y.o. male brought from [facility] to the Emergency Department [phone] with altered mental status. Last seen normal around 1600 today. Baseline apparently is conversational. Ambulates with Hinely. Found around 1700 in chair moaning and seemed stiff. No seizure activity reported. No hx of seizure. No acute trauma reported. Past history of traumatic SDH over a year ago. Not on blood thinners. R hip fx ~2 months ago but no surgical intervention. No prior history of stroke. Pt unable to provide any useful history.    Past Medical History  Diagnosis Date  . Diabetes mellitus without complication   . Hypertension   . Hypercholesterolemia   . SDH (subdural hematoma) 05/2010    due to fall  . Thoracic aortic aneurysm without rupture     Past Surgical History  Procedure Laterality Date  . Inguinal hernia repair Bilateral   . Hernia repair      No family history on file.  History  Substance Use Topics  . Smoking status: Former Games developer  . Smokeless tobacco: Not on file  . Alcohol Use: No      Review of Systems  Unable to perform ROS: Patient nonverbal  Neurological: Positive for facial asymmetry and speech difficulty.   A complete 10 system review of systems was obtained and all systems are negative except as noted in the HPI and PMH.   Allergies  Review of patient's allergies indicates no known allergies.  Home Medications   Current Outpatient Rx  Name  Route  Sig  Dispense  Refill  . amLODipine-benazepril  (LOTREL) 5-20 MG per capsule   Oral   Take 1 capsule by mouth daily.         . feeding supplement (PRO-STAT SUGAR FREE 64) LIQD   Oral   Take 30 mLs by mouth daily.         . insulin aspart (NOVOLOG) 100 UNIT/ML injection   Subcutaneous   Inject 1-10 Units into the skin 3 (three) times daily with meals.         . insulin glargine (LANTUS) 100 UNIT/ML injection   Subcutaneous   Inject 40 Units into the skin daily.          . metFORMIN (GLUCOPHAGE) 500 MG tablet   Oral   Take 500 mg by mouth 2 (two) times daily with a meal.         . rosuvastatin (CRESTOR) 20 MG tablet   Oral   Take 20 mg by mouth at bedtime.          Marland Kitchen HYDROcodone-acetaminophen (NORCO/VICODIN) 5-325 MG per tablet      Take one tablet every 4 hours as needed for pain   180 tablet   5     Triage Vitals: BP 141/103  Pulse 85  Temp(Src) 99.1 F (37.3 C) (Rectal)  Resp 17  SpO2 97%  Physical Exam  Physical Exam  Constitutional: He appears distressed.  HENT:  Head: Normocephalic and atraumatic.  Eyes: Conjunctivae are normal. Pupils are equal, round, and reactive to light.  Neck: Neck supple.  Cardiovascular: Normal rate.   No murmur heard. Pulmonary/Chest: Effort normal and breath sounds normal.  Abdominal: Soft. He exhibits no distension.  Musculoskeletal:  B/l LEs bandaged  Neurological:  Moaning. Speech incomprehensible. L facial droop. Seems to have L sided neglect. Would not follow commands well enough to further assess CN. Would squeeze R hand on command and grip strength seems ok. Will also move R lower extremity on command but not following commands beyond that.  LUE extension to pain. LLE with hip flexion to pain. Toes upgoing on L. downgoing right.   Skin: Skin is warm and dry. He is not diaphoretic.    ED Course  Procedures (including critical care time)  CRITICAL CARE Performed by: Garrett [name] Total critical care time:30 minutes Critical care time was exclusive of  separately billable procedures and treating other patients. Critical care was necessary to treat or prevent imminent or life-threatening deterioration. Critical care was time spent personally by me on the following activities: development of treatment plan with patient and/or surrogate as well as nursing, discussions with consultants, evaluation of patient's response to treatment, examination of patient, obtaining history from patient or surrogate, ordering and performing treatments and interventions, ordering and review of laboratory studies, ordering and review of radiographic studies, pulse oximetry and re-evaluation of patient's condition.  DIAGNOSTIC STUDIES: Oxygen Saturation is 97% on RA, normal by my interpretation.    COORDINATION OF CARE: 6:44 PM- Pt's family advised of plan for treatment and pt's family agree.  Medications  0.9 %  sodium chloride infusion (not administered)  alteplase (ACTIVASE) 1 mg/mL infusion 0.9 mg/kg (not administered)     Labs Reviewed  DIFFERENTIAL - Abnormal; Notable for the following:    Neutrophils Relative % 78 (*)    Neutro Abs 7.9 (*)    All other components within normal limits  URINALYSIS, ROUTINE W REFLEX MICROSCOPIC - Abnormal; Notable for the following:    Glucose, UA >1000 (*)    All other components within normal limits  GLUCOSE, CAPILLARY - Abnormal; Notable for the following:    Glucose-Capillary 215 (*)    All other components within normal limits  URINE MICROSCOPIC-ADD ON - Abnormal; Notable for the following:    Squamous Epithelial / LPF FEW (*)    Bacteria, UA FEW (*)    All other components within normal limits  POCT I-STAT, CHEM 8 - Abnormal; Notable for the following:    Glucose, Bld 224 (*)    All other components within normal limits  POCT I-STAT, CHEM 8 - Abnormal; Notable for the following:    Glucose, Bld 225 (*)    All other components within normal limits  PROTIME-INR  APTT  CBC  TROPONIN I  URINE RAPID DRUG  SCREEN (HOSP PERFORMED)  ETHANOL  COMPREHENSIVE METABOLIC PANEL  POCT I-STAT TROPONIN I  POCT I-STAT TROPONIN I   Ct Head Wo Contrast  09/04/2012   *RADIOLOGY REPORT*  Clinical Data: Altered mental status.  CT HEAD WITHOUT CONTRAST  Technique:  Contiguous axial images were obtained from the base of the skull through the vertex without contrast.  Comparison: 07/21/2012 and 05/26/2010.  Findings: Motion degraded exam.  Bifrontal encephalomalacia consistent with result of prior trauma.  No intracranial hemorrhage.  Remote left basal ganglia infarct versus prominent perivascular space stable.  Subtle hypodensity posterior right opercular region.  Acute infarct not excluded.  Motion limits evaluation.  No intracranial  mass lesion detected on this unenhanced exam.  Vascular calcifications.  Atrophy without hydrocephalus.  Mild polypoid opacification in the left maxillary sinus.  Mild mucosal thickening left ethmoid sinus air cells.  IMPRESSION: Motion degraded exam.  Subtle hypodensity posterior right opercular region.  Acute infarct not excluded.  Motion limits evaluation.  Bifrontal encephalomalacia result of prior trauma.  Mild polypoid opacification in the left maxillary sinus.  Mild mucosal thickening left ethmoid sinus air cells.   Original Report Authenticated By: Lacy Duverney, M.D.     1. Acute ischemic stroke     EKG:  Rhythm: normal sinus Vent. rate 89 BPM PR interval 160 ms QRS duration 132 ms QT/QTc 374/455 ms LVH ST segments: NS ST changes     MDM  78yM with profound deficits consistent with CVA. Last seen normal around 1600. Stroke alert/work-up initiated.    7:35 PM Teleneurology taking unacceptably long. Secretary calling neurology at [facility].  8:02 PM Discussed with Dr Amada Jupiter, neurology. No obvious contraindications to tPA. Discussed with family including risks of bleeding and that at even higher risk with changes noted on CT. Family would like to proceed. Pt is  DNR/DNI. Family fine with intubation if needed for procedure, but "not to just keep him alive."    I personally preformed the services scribed in my presence. The recorded information has been reviewed is accurate. Garrett Razor, MD.  Garrett Razor, MD 09/04/2012 2010

## 2012-08-25 NOTE — Procedures (Signed)
S/P bilateral common carotid arteriograms and lt vertebral arteriogram. Rt CFA approach. Findings . 1.Occluded RT MCA inf divison angular branch ,and RT ACA A2 seg . 2.Large area of hypoperfusion in the Rt parietal cortical/subcortical region

## 2012-08-25 NOTE — Consult Note (Signed)
PULMONARY  / CRITICAL CARE MEDICINE  Name: Garrett [name] MRN: [id number] DOB: March 05, 1934    ADMISSION DATE:  08/27/2012 CONSULTATION DATE:  09/08/2012  REFERRING MD :  Amada Jupiter PRIMARY SERVICE: Neurology  CHIEF COMPLAINT:  Stroke, Vent management  BRIEF PATIENT DESCRIPTION: 79 y/o male with DM, HTN was transferred from [facility] on 6/11 due to an acute ischemic stroke with symptoms refractory to tPA.  He underwent an IR procedure on 6/11 and PCCM was consulted for post procedure vent management.   SIGNIFICANT EVENTS / STUDIES:  6/11 AP ED for stroke symptoms > tpa 6/11 cerebral angiogram> occluded Rt MCA inf division branch and R ACA A2 seg; large area of hypoperfusion in the R parietal cortical/subcortical region   LINES / TUBES: 6/11 ETT >> 6/11 R fem arterial sheath >>  CULTURES: none  ANTIBIOTICS: none  HISTORY OF PRESENT ILLNESS:  79 y/o male with DM, HTN was transferred from [facility] on 6/11 due to an acute ischemic stroke with symptoms refractory to tPA.  He underwent an IR procedure on 6/11 and PCCM was consulted for post procedure vent management.  He was admitted to a SNF two months ago after he developed a hip fracture which was treated non-operatively.  Around 1700 on 6/11 he was found to have left sided facial droop and left sided weakness.  He went to [facility] where tPA was given and then went for an IR procedure at [facility] but the occluded vessel could not be revascularized.  PCCM consulted for vent management.  PAST MEDICAL HISTORY :  Past Medical History  Diagnosis Date  . Diabetes mellitus without complication   . Hypertension   . Hypercholesterolemia   . SDH (subdural hematoma) 05/2010    due to fall  . Thoracic aortic aneurysm without rupture    Past Surgical History  Procedure Laterality Date  . Inguinal hernia repair Bilateral   . Hernia repair     Prior to Admission medications   Medication Sig Start Date End Date Taking? Authorizing Provider  amLODipine-benazepril  (LOTREL) 5-20 MG per capsule Take 1 capsule by mouth daily.   Yes Historical Provider, MD  feeding supplement (PRO-STAT SUGAR FREE 64) LIQD Take 30 mLs by mouth daily.   Yes Historical Provider, MD  insulin aspart (NOVOLOG) 100 UNIT/ML injection Inject 1-10 Units into the skin 3 (three) times daily with meals.   Yes Historical Provider, MD  insulin glargine (LANTUS) 100 UNIT/ML injection Inject 40 Units into the skin daily.    Yes Historical Provider, MD  metFORMIN (GLUCOPHAGE) 500 MG tablet Take 500 mg by mouth 2 (two) times daily with a meal.   Yes Historical Provider, MD  rosuvastatin (CRESTOR) 20 MG tablet Take 20 mg by mouth at bedtime.    Yes Historical Provider, MD  HYDROcodone-acetaminophen (NORCO/VICODIN) 5-325 MG per tablet Take one tablet every 4 hours as needed for pain 07/12/12   Tiffany L Reed, DO   No Known Allergies  FAMILY HISTORY:  No family history on file. SOCIAL HISTORY:  reports that he has quit smoking. He does not have any smokeless tobacco history on file. He reports that he does not drink alcohol or use illicit drugs.   REVIEW OF SYSTEMS:  Cannot obtain due to intubation  SUBJECTIVE:   VITAL SIGNS: Temp:  [99.1 F (37.3 C)-99.2 F (37.3 C)] 99.2 F (37.3 C) (06/11 2317) Pulse Rate:  [85-112] 101 (06/11 2306) Resp:  [16-24] 16 (06/11 2306) BP: (125-191)/(74-103) 191/91 mmHg (06/11 2306) SpO2:  [95 %-  100 %] 99 % (06/11 2306) FiO2 (%):  [40 %] 40 % (06/11 2306) Weight:  [83.008 kg (183 lb)] 83.008 kg (183 lb) (06/11 1953) HEMODYNAMICS:   VENTILATOR SETTINGS: Vent Mode:  [-] PRVC FiO2 (%):  [40 %] 40 % Set Rate:  [16 bmp] 16 bmp Vt Set:  [560 mL] 560 mL PEEP:  [5 cmH20] 5 cmH20 Plateau Pressure:  [17 cmH20] 17 cmH20 INTAKE / OUTPUT: Intake/Output     06/11 0701 - 06/12 0700   I.V. (mL/kg) 750 (9)   Total Intake(mL/kg) 750 (9)   Net +750         PHYSICAL EXAMINATION:  Gen: chronically ill appearing, sedated on vent HEENT: NCAT, PERRL, EOMi, ETT  in place PULM: few wheezes on left lung, CTA on R CV: RRR, no mgr, no JVD AB: BS+, soft, nontender, no hsm Ext: warm, no edema, no clubbing, no cyanosis Derm: dry skin, chronic venous stasis changes R leg Neuro: sedated on vent, does not follow commands, reaches for tube with R hand, moves R leg, does not move L arm/leg   LABS:  Recent Labs Lab 09/08/2012 1905 08/31/2012 1908 09/13/2012 1926  HGB 14.2 14.3 15.3  WBC 10.1  --   --   PLT 268  --   --   NA 140 138 140  K 3.9 4.3 4.2  CL 102 105 104  CO2 29  --   --   GLUCOSE 226* 224* 225*  BUN 15 16 16   CREATININE 0.76 0.90 0.90  CALCIUM 9.5  --   --   AST 11  --   --   ALT 13  --   --   ALKPHOS 106  --   --   BILITOT 0.2*  --   --   PROT 6.7  --   --   ALBUMIN 3.4*  --   --   APTT 28  --   --   INR 0.94  --   --   TROPONINI <0.30  --   --     Recent Labs Lab 08/24/12 1931 09/11/2012 0658 08/28/2012 1233 08/21/2012 1904 08/28/2012 2315  GLUCAP 269* 146* 186* 215* 109*    CXR: pending  ASSESSMENT / PLAN:  NEUROLOGIC A:  Acute ischemic stroke (R MCA/ACA) s/p tPA; IR clot removal attempted, but unsuccessful due to location of clot P:   -further imaging per neurology -discuss prognosis with neurology on 6/12  PULMONARY A: Post procedure respiratory failure P:   -full vent support overnight -ABG now -daily wua/sbt -check with neurology on 6/12 about imaging procedures prior to extubation  CARDIOVASCULAR A: Hypertension P:  -per post tPA protocol/neurology  RENAL A:  No acute issues P:   -monitor UOP  GASTROINTESTINAL A:  No acute issues P:   -OG tube -PPI for SUP -start tube feedings 6/12 if not extubated  HEMATOLOGIC A:  Post TPA P:  -monitor for bleeding  INFECTIOUS A:  No acute issues P:   -monitor fever curve, WBC  ENDOCRINE A:  DM2 P:   -ICU hyperglycemia protocol  TODAY'S SUMMARY:  79 y/o male with DM2, HTN who came from a rehab facility on 6/11 with an acute ischemic stroke.  IR  guided clot removal attempted but unsuccessful.  PCCM consulted for post op vent management. Plan to keep on vent overnight, discuss prognosis/further imaging with neurology on 6/12 prior to extubation.  Code status: DNR per multiple notes/discussion with family.  Limited code order to reflect mechanical ventilation.  Would need to discuss pressors, etc with family if his condition changes.  I have personally obtained a history, examined the patient, evaluated laboratory and imaging results, formulated the assessment and plan and placed orders. CRITICAL CARE: The patient is critically ill with multiple organ systems failure and requires high complexity decision making for assessment and support, frequent evaluation and titration of therapies, application of advanced monitoring technologies and extensive interpretation of multiple databases. Critical Care Time devoted to patient care services described in this note is 45 minutes.   Fonnie Jarvis Pulmonary and Critical Care Medicine [facility] Pager: [phone]  09/07/2012, 11:58 PM

## 2012-08-25 NOTE — ED Provider Notes (Signed)
History  This chart was scribed for Garrett Razor, MD by Garrett [name], ED Scribe. This patient was seen in room APA07/[facility]07 and the patient's care was started at 6:41PM.   CSN: [id number]  Arrival date & time 08/16/2012  1820     Chief Complaint  Patient presents with  . Altered Mental Status     The history is provided by a relative. The history is limited by the condition of the patient. No language interpreter was used.     HPI Comments: Garrett [name] is a 79 y.o. male brought from [facility] to the Emergency Department [phone] with altered mental status. Last seen normal around 1600 today. Baseline apparently is conversational. Ambulates with Garrett [name]. Found around 1700 in chair moaning and seemed stiff. No seizure activity reported. No hx of seizure. No acute trauma reported. Past history of traumatic SDH over a year ago. Not on blood thinners. R hip fx ~2 months ago, but no surgical intervention. No prior hx of stroke. Pt unable to provide any useful history.    Past Medical History  Diagnosis Date  . Diabetes mellitus without complication   . Hypertension   . Hypercholesterolemia   . SDH (subdural hematoma) 05/2010    due to fall  . Thoracic aortic aneurysm without rupture     Past Surgical History  Procedure Laterality Date  . Inguinal hernia repair Bilateral   . Hernia repair      No family history on file.  History  Substance Use Topics  . Smoking status: Former Games developer  . Smokeless tobacco: Not on file  . Alcohol Use: No      Review of Systems  Unable to perform ROS: Patient nonverbal  Neurological: Positive for facial asymmetry and speech difficulty.   A complete 10 system review of systems was obtained and all systems are negative except as noted in the HPI and PMH.   Allergies  Review of patient's allergies indicates no known allergies.  Home Medications   Current Outpatient Rx  Name  Route  Sig  Dispense  Refill  . amLODipine-benazepril (LOTREL)  5-20 MG per capsule   Oral   Take 1 capsule by mouth daily.         . feeding supplement (PRO-STAT SUGAR FREE 64) LIQD   Oral   Take 30 mLs by mouth daily.         . insulin aspart (NOVOLOG) 100 UNIT/ML injection   Subcutaneous   Inject 1-10 Units into the skin 3 (three) times daily with meals.         . insulin glargine (LANTUS) 100 UNIT/ML injection   Subcutaneous   Inject 40 Units into the skin daily.          . metFORMIN (GLUCOPHAGE) 500 MG tablet   Oral   Take 500 mg by mouth 2 (two) times daily with a meal.         . rosuvastatin (CRESTOR) 20 MG tablet   Oral   Take 20 mg by mouth at bedtime.          Marland Kitchen HYDROcodone-acetaminophen (NORCO/VICODIN) 5-325 MG per tablet      Take one tablet every 4 hours as needed for pain   180 tablet   5     Triage Vitals: BP 141/103  Pulse 85  Temp(Src) 99.1 F (37.3 C) (Rectal)  Resp 17  SpO2 97%  Physical Exam   Physical Exam  Constitutional: He appears distressed.  HENT:  Head: Normocephalic and  atraumatic.  Eyes: Conjunctivae are normal. Pupils are equal, round, and reactive to light.  Neck: Neck supple.  Cardiovascular: Normal rate.  No murmur heard.  Pulmonary/Chest: Effort normal and breath sounds normal.  Abdominal: Soft. He exhibits no distension.  Musculoskeletal:  B/l LEs bandaged  Neurological:  Moaning. Speech incomprehensible. L facial droop. Seems to have L sided neglect. Would not follow commands well enough to further assess CN. Would squeeze R hand on command and grip strength seems ok. Will also move R lower extremity on command but not following commands beyond that. LUE extension to pain. LLE with hip flexion to pain. Toes upgoing on L. downgoing right.  Skin: Skin is warm and dry. He is not diaphoretic.   ED Course  Procedures (including critical care time)  CRITICAL CARE Performed by: Garrett [name]  Total critical care time: 35 minutes Critical care time was exclusive of  separately billable procedures and treating other patients. Critical care was necessary to treat or prevent imminent or life-threatening deterioration. Critical care was time spent personally by me on the following activities: development of treatment plan with patient and/or surrogate as well as nursing, discussions with consultants, evaluation of patient's response to treatment, examination of patient, obtaining history from patient or surrogate, ordering and performing treatments and interventions, ordering and review of laboratory studies, ordering and review of radiographic studies, pulse oximetry and re-evaluation of patient's condition.   DIAGNOSTIC STUDIES: Oxygen Saturation is 97% on RA, normal by my interpretation.    COORDINATION OF CARE: 6:44 PM- Pt's family advised of plan for treatment and pt's family agree.  Medications  0.9 %  sodium chloride infusion (not administered)  alteplase (ACTIVASE) 1 mg/mL infusion 75 mg (not administered)  alteplase (ACTIVASE) 1 mg/mL injection (not administered)     Labs Reviewed  DIFFERENTIAL - Abnormal; Notable for the following:    Neutrophils Relative % 78 (*)    Neutro Abs 7.9 (*)    All other components within normal limits  COMPREHENSIVE METABOLIC PANEL - Abnormal; Notable for the following:    Glucose, Bld 226 (*)    Albumin 3.4 (*)    Total Bilirubin 0.2 (*)    GFR calc non Af Amer 86 (*)    All other components within normal limits  URINALYSIS, ROUTINE W REFLEX MICROSCOPIC - Abnormal; Notable for the following:    Glucose, UA >1000 (*)    All other components within normal limits  GLUCOSE, CAPILLARY - Abnormal; Notable for the following:    Glucose-Capillary 215 (*)    All other components within normal limits  URINE MICROSCOPIC-ADD ON - Abnormal; Notable for the following:    Squamous Epithelial / LPF FEW (*)    Bacteria, UA FEW (*)    All other components within normal limits  POCT I-STAT, CHEM 8 - Abnormal; Notable for  the following:    Glucose, Bld 224 (*)    All other components within normal limits  POCT I-STAT, CHEM 8 - Abnormal; Notable for the following:    Glucose, Bld 225 (*)    All other components within normal limits  ETHANOL  PROTIME-INR  APTT  CBC  TROPONIN I  URINE RAPID DRUG SCREEN (HOSP PERFORMED)  POCT I-STAT TROPONIN I  POCT I-STAT TROPONIN I   Ct Head Wo Contrast  09/06/2012   *RADIOLOGY REPORT*  Clinical Data: Altered mental status.  CT HEAD WITHOUT CONTRAST  Technique:  Contiguous axial images were obtained from the base of the skull through the vertex  without contrast.  Comparison: 07/21/2012 and 05/26/2010.  Findings: Motion degraded exam.  Bifrontal encephalomalacia consistent with result of prior trauma.  No intracranial hemorrhage.  Remote left basal ganglia infarct versus prominent perivascular space stable.  Subtle hypodensity posterior right opercular region.  Acute infarct not excluded.  Motion limits evaluation.  No intracranial mass lesion detected on this unenhanced exam.  Vascular calcifications.  Atrophy without hydrocephalus.  Mild polypoid opacification in the left maxillary sinus.  Mild mucosal thickening left ethmoid sinus air cells.  IMPRESSION: Motion degraded exam.  Subtle hypodensity posterior right opercular region.  Acute infarct not excluded.  Motion limits evaluation.  Bifrontal encephalomalacia result of prior trauma.  Mild polypoid opacification in the left maxillary sinus.  Mild mucosal thickening left ethmoid sinus air cells.   Original Report Authenticated By: Lacy Duverney, M.D.     1. Acute ischemic stroke       EKG:  Rhythm: normal sinus  Vent. rate 89 BPM  PR interval 160 ms  QRS duration 132 ms  QT/QTc 374/455 ms  LVH  ST segments: NS ST changes  No diagnosis found.   MDM   [phone] with profound deficits consistent with CVA. Last seen normal around 1600. Stroke alert/work-up initiated.   7:35 PM  Teleneurology taking unacceptably long.  Secretary calling neurology at [employer].   Discussed with Dr Amada Jupiter, neurology. No obvious contraindications to tPA. Discussed with family including risks of bleeding and that at higher risk with changes noted on CT. Family would like to proceed. Pt is DNR/DNI. They are fine with intubation for procedures if needed, but "not to just keep him alive."    I personally preformed the services scribed in my presence. The recorded information has been reviewed is accurate. Garrett Razor, MD.       Garrett Razor, MD 08/27/2012 2015

## 2012-08-25 NOTE — Anesthesia Preprocedure Evaluation (Addendum)
Anesthesia Evaluation  Patient identified by MRN, date of birth, ID band Patient confused    Reviewed: Allergy & Precautions, H&P , NPO status , Patient's Chart, lab work & pertinent test results, Unable to perform ROS - Chart review only  Airway Mallampati: II TM Distance: >3 FB     Dental  (+) Teeth Intact   Pulmonary  + rhonchi         Cardiovascular hypertension, Pt. on medications + Peripheral Vascular Disease Rhythm:Regular Rate:Tachycardia  Thoracic aneurysm   Neuro/Psych    GI/Hepatic GERD-  ,  Endo/Other  diabetes, Type 2  Renal/GU      Musculoskeletal   Abdominal (+) + obese,   Peds  Hematology   Anesthesia Other Findings noncooperative  Reproductive/Obstetrics                          Anesthesia Physical Anesthesia Plan  ASA: IV and emergent  Anesthesia Plan: General   Post-op Pain Management:    Induction: Intravenous, Rapid sequence and Cricoid pressure planned  Airway Management Planned: Oral ETT  Additional Equipment:   Intra-op Plan:   Post-operative Plan: Possible Post-op intubation/ventilation  Informed Consent: I have reviewed the patients History and Physical, chart, labs and discussed the procedure including the risks, benefits and alternatives for the proposed anesthesia with the patient or authorized representative who has indicated his/her understanding and acceptance.     Plan Discussed with: CRNA and Surgeon  Anesthesia Plan Comments:        Anesthesia Quick Evaluation

## 2012-08-25 NOTE — ED Notes (Signed)
Anesthesia arrive and will be resuming care

## 2012-08-25 NOTE — Anesthesia Postprocedure Evaluation (Signed)
  Anesthesia Post-op Note  Patient: Garrett [name]  Procedure(s) Performed: * No procedures listed *  Patient Location: ICU  Anesthesia Type:General  Level of Consciousness: Patient remains intubated per anesthesia plan  Airway and Oxygen Therapy: Patient remains intubated per anesthesia plan and Patient placed on Ventilator (see vital sign flow sheet for setting)  Post-op Pain: none  Post-op Assessment: Post-op Vital signs reviewed, Patient's Cardiovascular Status Stable, Respiratory Function Stable, Patent Airway, No signs of Nausea or vomiting and Pain level controlled  Post-op Vital Signs: stable  Complications: No apparent anesthesia complications

## 2012-08-25 NOTE — ED Notes (Signed)
Resident of [facility]. Pt was A/O at 1600. Nurse went back in room at 1700 and pt was stiff in chair. No grip to the left with left facial droop and slurred speech. Hx of subdural hematoma from a fall recently.

## 2012-08-25 NOTE — Preoperative (Signed)
Beta Blockers   Reason not to administer Beta Blockers:Not Applicable 

## 2012-08-25 NOTE — ED Notes (Signed)
Anesthesia notified.

## 2012-08-25 NOTE — Transfer of Care (Signed)
Immediate Anesthesia Transfer of Care Note  Patient: Garrett [name]  Procedure(s) Performed: * No procedures listed *  Patient Location: PACU and ICU  Anesthesia Type:General  Level of Consciousness: Patient remains intubated per anesthesia plan  Airway & Oxygen Therapy: Patient remains intubated per anesthesia plan and Patient placed on Ventilator (see vital sign flow sheet for setting)  Post-op Assessment: Report given to PACU RN and Post -op Vital signs reviewed and stable  Post vital signs: Reviewed and stable  Complications: No apparent anesthesia complications

## 2012-08-26 ENCOUNTER — Inpatient Hospital Stay (HOSPITAL_COMMUNITY): Payer: Medicare Other

## 2012-08-26 DIAGNOSIS — L97909 Non-pressure chronic ulcer of unspecified part of unspecified lower leg with unspecified severity: Secondary | ICD-10-CM

## 2012-08-26 DIAGNOSIS — E785 Hyperlipidemia, unspecified: Secondary | ICD-10-CM | POA: Diagnosis present

## 2012-08-26 DIAGNOSIS — L03119 Cellulitis of unspecified part of limb: Secondary | ICD-10-CM

## 2012-08-26 DIAGNOSIS — I519 Heart disease, unspecified: Secondary | ICD-10-CM

## 2012-08-26 DIAGNOSIS — R509 Fever, unspecified: Secondary | ICD-10-CM | POA: Diagnosis not present

## 2012-08-26 DIAGNOSIS — I10 Essential (primary) hypertension: Secondary | ICD-10-CM | POA: Diagnosis present

## 2012-08-26 DIAGNOSIS — J95821 Acute postprocedural respiratory failure: Secondary | ICD-10-CM | POA: Diagnosis present

## 2012-08-26 DIAGNOSIS — J690 Pneumonitis due to inhalation of food and vomit: Secondary | ICD-10-CM | POA: Diagnosis not present

## 2012-08-26 DIAGNOSIS — I639 Cerebral infarction, unspecified: Secondary | ICD-10-CM | POA: Diagnosis present

## 2012-08-26 LAB — GLUCOSE, CAPILLARY
Glucose-Capillary: 325 mg/dL — ABNORMAL HIGH (ref 70–99)
Glucose-Capillary: 166 mg/dL — ABNORMAL HIGH (ref 70–99)
Glucose-Capillary: 121 mg/dL — ABNORMAL HIGH (ref 70–99)
Glucose-Capillary: 117 mg/dL — ABNORMAL HIGH (ref 70–99)
Glucose-Capillary: 169 mg/dL — ABNORMAL HIGH (ref 70–99)

## 2012-08-26 LAB — BLOOD GAS, ARTERIAL
Acid-Base Excess: 0.3 mmol/L (ref 0.0–2.0)
Drawn by: 331761
FIO2: 0.4 %
PEEP: 5 cmH2O
RATE: 16 resp/min
pCO2 arterial: 43.8 mmHg (ref 35.0–45.0)
pH, Arterial: 7.374 (ref 7.350–7.450)
pO2, Arterial: 95.4 mmHg (ref 80.0–100.0)

## 2012-08-26 LAB — CBC WITH DIFFERENTIAL/PLATELET
Basophils Absolute: 0 10*3/uL (ref 0.0–0.1)
Basophils Relative: 0 % (ref 0–1)
Eosinophils Absolute: 0 10*3/uL (ref 0.0–0.7)
Hemoglobin: 12 g/dL — ABNORMAL LOW (ref 13.0–17.0)
MCH: 29.9 pg (ref 26.0–34.0)
MCHC: 34.4 g/dL (ref 30.0–36.0)
Monocytes Relative: 6 % (ref 3–12)
Neutro Abs: 10.1 10*3/uL — ABNORMAL HIGH (ref 1.7–7.7)
Neutrophils Relative %: 85 % — ABNORMAL HIGH (ref 43–77)
Platelets: 233 10*3/uL (ref 150–400)

## 2012-08-26 LAB — LIPID PANEL
HDL: 35 mg/dL — ABNORMAL LOW (ref >39)
Total CHOL/HDL Ratio: 2.3 RATIO
Triglycerides: 61 mg/dL (ref <150)

## 2012-08-26 LAB — HEMOGLOBIN A1C
Hgb A1c MFr Bld: 8.5 % — ABNORMAL HIGH (ref <5.7)
Mean Plasma Glucose: 197 mg/dL — ABNORMAL HIGH (ref <117)

## 2012-08-26 LAB — BASIC METABOLIC PANEL
Chloride: 104 mEq/L (ref 96–112)
GFR calc Af Amer: >90 mL/min (ref >90)
GFR calc non Af Amer: 84 mL/min — ABNORMAL LOW (ref >90)
Potassium: 3.7 mEq/L (ref 3.5–5.1)
Sodium: 139 mEq/L (ref 135–145)

## 2012-08-26 LAB — MRSA PCR SCREENING: MRSA by PCR: POSITIVE — AB

## 2012-08-26 MED ORDER — INSULIN GLARGINE 100 UNIT/ML ~~LOC~~ SOLN
10.0000 [IU] | Freq: Every day | SUBCUTANEOUS | Status: DC
  Administered 2012-08-26: 10 [IU] via SUBCUTANEOUS
  Filled 2012-08-26 (×2): qty 0.1

## 2012-08-26 MED ORDER — CHLORHEXIDINE GLUCONATE CLOTH 2 % EX PADS
6.0000 | MEDICATED_PAD | Freq: Every day | CUTANEOUS | Status: AC
  Administered 2012-08-26 – 2012-08-30 (×4): 6 via TOPICAL

## 2012-08-26 MED ORDER — MUPIROCIN 2 % EX OINT
1.0000 "application " | TOPICAL_OINTMENT | Freq: Two times a day (BID) | CUTANEOUS | Status: AC
  Administered 2012-08-26 – 2012-08-29 (×8): 1 via NASAL
  Filled 2012-08-26: qty 22

## 2012-08-26 MED ORDER — PANTOPRAZOLE SODIUM 40 MG PO PACK
40.0000 mg | PACK | ORAL | Status: DC
  Administered 2012-08-26 – 2012-08-29 (×4): 40 mg
  Filled 2012-08-26 (×6): qty 20

## 2012-08-26 MED ORDER — PRO-STAT SUGAR FREE PO LIQD
60.0000 mL | Freq: Three times a day (TID) | ORAL | Status: DC
  Administered 2012-08-26 – 2012-08-29 (×11): 60 mL
  Filled 2012-08-26 (×14): qty 60

## 2012-08-26 MED ORDER — SODIUM CHLORIDE 0.9 % IV SOLN
3.0000 g | Freq: Three times a day (TID) | INTRAVENOUS | Status: DC
  Administered 2012-08-26 – 2012-08-29 (×10): 3 g via INTRAVENOUS
  Filled 2012-08-26 (×14): qty 3

## 2012-08-26 MED ORDER — INSULIN ASPART 100 UNIT/ML ~~LOC~~ SOLN
0.0000 [IU] | SUBCUTANEOUS | Status: DC
  Administered 2012-08-26 (×2): 3 [IU] via SUBCUTANEOUS
  Administered 2012-08-26: 2 [IU] via SUBCUTANEOUS
  Administered 2012-08-27: 5 [IU] via SUBCUTANEOUS
  Administered 2012-08-27: 3 [IU] via SUBCUTANEOUS
  Administered 2012-08-27 (×4): 5 [IU] via SUBCUTANEOUS
  Administered 2012-08-28: 8 [IU] via SUBCUTANEOUS
  Administered 2012-08-28: 5 [IU] via SUBCUTANEOUS
  Administered 2012-08-28: 8 [IU] via SUBCUTANEOUS
  Administered 2012-08-28 – 2012-08-29 (×6): 5 [IU] via SUBCUTANEOUS
  Administered 2012-08-29: 8 [IU] via SUBCUTANEOUS
  Administered 2012-08-29: 11 [IU] via SUBCUTANEOUS
  Administered 2012-08-29 – 2012-08-30 (×2): 5 [IU] via SUBCUTANEOUS
  Administered 2012-08-30: 8 [IU] via SUBCUTANEOUS

## 2012-08-26 MED ORDER — ADULT MULTIVITAMIN W/MINERALS CH
1.0000 | ORAL_TABLET | Freq: Every day | ORAL | Status: DC
  Administered 2012-08-26 – 2012-08-29 (×4): 1
  Filled 2012-08-26 (×5): qty 1

## 2012-08-26 MED ORDER — JEVITY 1.2 CAL PO LIQD
1000.0000 mL | ORAL | Status: DC
  Administered 2012-08-26: 1000 mL
  Administered 2012-08-28
  Filled 2012-08-26 (×4): qty 1000

## 2012-08-26 MED ORDER — ATORVASTATIN CALCIUM 40 MG PO TABS: 40.0000 mg | ORAL_TABLET | Freq: Every day | ORAL | Status: DC

## 2012-08-26 MED ORDER — VITAL AF 1.2 CAL PO LIQD: 1000.0000 mL | ORAL | Status: DC

## 2012-08-26 MED ORDER — ATORVASTATIN CALCIUM 40 MG PO TABS
40.0000 mg | ORAL_TABLET | Freq: Every day | ORAL | Status: DC
  Administered 2012-08-26 – 2012-08-29 (×4): 40 mg
  Filled 2012-08-26 (×5): qty 1

## 2012-08-26 NOTE — Progress Notes (Signed)
INITIAL NUTRITION ASSESSMENT  DOCUMENTATION CODES Per approved criteria  -Not Applicable   INTERVENTION: 1. Initiate Jevity 1.2 @ 20 ml/hr via OG tube and increase by 10 ml every 4 hours to goal rate of 30 ml/hr. 60 ml Prostat TID.  At goal rate, tube feeding regimen will provide 1464 kcal, 129 grams of protein, and 583 ml of H2O.   TF regimen and current Propofol will provide 1857 total calories (99% of needs)  2. MVI daily  NUTRITION DIAGNOSIS: Inadequate oral intake related to inability to eat as evidenced by NPO status.  Goal: Pt to meet >/= 90% of their estimated nutrition needs.   Monitor:  Vent status, TF tolerance, weight trend, labs  Reason for Assessment: Consult received to initiate and manage enteral nutrition support.  79 y.o. male  Admitting Dx: Acute ischemic stroke  ASSESSMENT: Pt discussed during ICU rounds and with RN.  Per RN pt with recent hx of hip fracture and was at [facility] for rehab. Pt is s/p iv t-PA, pt tx to [facility] but R MCA clot was not amenable to intervention.  Patient is currently intubated on ventilator support.  MV: 10.5 Temp:Temp (24hrs), Avg:100.1 F (37.8 C), Min:99.1 F (37.3 C), Max:102.8 F (39.3 C)  Propofol: 14.9 ml/hr providing 393 kcal per day from lipid (21% of pt total calorie needs)   Height: Ht Readings from Last 1 Encounters:  09/06/2012 5\' 6"  (1.676 m)    Weight: Wt Readings from Last 1 Encounters:  08/23/2012 182 lb 8.7 oz (82.8 kg)    Ideal Body Weight: 64.5 kg  % Ideal Body Weight: 128%  Wt Readings from Last 10 Encounters:  09/09/2012 182 lb 8.7 oz (82.8 kg)  08/16/12 183 lb (83.008 kg)  07/18/12 180 lb (81.647 kg)  07/07/12 180 lb (81.647 kg)  02/02/09 184 lb (83.462 kg)  02/04/08 185 lb 2.1 oz (83.975 kg)  08/05/07 185 lb 6.1 oz (84.088 kg)  03/29/07 179 lb 6.1 oz (81.367 kg)    Usual Body Weight: 180 lb  % Usual Body Weight: > 100%  BMI:  Body mass index is 29.48 kg/(m^2). Overweight  Estimated  Nutritional Needs: Kcal: 1871 Protein: 120-140 grams Fluid: >2.4 L/day  Skin: bilateral stasis ulcers on calves  Diet Order: NPO  EDUCATION NEEDS: -No education needs identified at this time   Intake/Output Summary (Last 24 hours) at 08/26/12 0929 Last data filed at 08/26/12 0800  Gross per 24 hour  Intake 1485.02 ml  Output    640 ml  Net 845.02 ml    Last BM: PTA   Labs:   Recent Labs Lab 09/12/2012 1905 08/31/2012 1908 08/27/2012 1926 08/26/12 0518  NA 140 138 140 139  K 3.9 4.3 4.2 3.7  CL 102 105 104 104  CO2 29  --   --  25  BUN 15 16 16 15   CREATININE 0.76 0.90 0.90 0.82  CALCIUM 9.5  --   --  8.3*  GLUCOSE 226* 224* 225* 181*    CBG (last 3)   Recent Labs  09/05/2012 1904 08/28/2012 2315 08/26/12 0437  GLUCAP 215* 109* 166*   Lab Results  Component Value Date   HGBA1C 10.0* 07/07/2012    Scheduled Meds: . ampicillin-sulbactam (UNASYN) IV  3 g Intravenous Q8H  . antiseptic oral rinse  15 mL Mouth Rinse QID  . atorvastatin  40 mg Per Tube q1800  . chlorhexidine  15 mL Mouth Rinse BID  . Chlorhexidine Gluconate Cloth  6 each Topical  Q0600  . insulin aspart  0-15 Units Subcutaneous Q4H  . insulin glargine  10 Units Subcutaneous QHS  . mupirocin ointment  1 application Nasal BID  . pantoprazole sodium  40 mg Per Tube Q24H  . propofol        Continuous Infusions: . sodium chloride    . sodium chloride 75 mL/hr at 09/08/2012 2355  . propofol 30 mcg/kg/min (08/26/12 0427)    Past Medical History  Diagnosis Date  . Diabetes mellitus without complication   . Hypertension   . Hypercholesterolemia   . SDH (subdural hematoma) 05/2010    due to fall  . Thoracic aortic aneurysm without rupture     Past Surgical History  Procedure Laterality Date  . Inguinal hernia repair Bilateral   . Hernia repair      Kendell Bane RD, LDN, CNSC [phone] Pager [phone] After Hours Pager

## 2012-08-26 NOTE — Progress Notes (Signed)
MRI with large right MCA and ACA ischemic infarcts accompanied by malignant hemorrhagic transformation - intraparenchymal, intraventricular and subarachnoid. 8 mm midline shift. At high risk for neurologic deterioration and decline. Suspect embolic source as large vessels are open - 2D without obvious clot, ? Related to angio. RN reports NSR with freq PVCs, no afib.  Keep intubated No antithrombotics given hemorrhage Will check CT in am. If continued progression, will consider 3%, etc.  Dr. Pearlean Brownie will meet with the family in am to discuss dx and prognosis.  Discussed with Dr. Pearlean Brownie and Baird Lyons, RN  Annie Main, MSN, RN, ANVP-BC, ANP-BC, GNP-BC [facility] Pager: [phone] 08/26/2012 5:41 PM  I have personally, evaluated imaging results, and formulated the assessment and plan of care. I agree with the above.

## 2012-08-26 NOTE — Consult Note (Signed)
PULMONARY  / CRITICAL CARE MEDICINE  Name: Garrett [name] MRN: [id number] DOB: [birth year]    ADMISSION DATE:  08/22/2012 CONSULTATION DATE:  09/10/2012  REFERRING MD :  Amada Jupiter PRIMARY SERVICE: Neurology  CHIEF COMPLAINT:  Stroke, Vent management  BRIEF PATIENT DESCRIPTION:  79 yo male former smoker presented to [facility] with altered mental status with Lt facial droop and Lt sided weakness >> given tPA w/o improvement.  Transferred to [facility] for tx with neuro IR >> intubated for procedure, and PCCM consulted to assist with vent/medical management.   SIGNIFICANT EVENTS:  6/11 [facility] ED for stroke symptoms > tpa, neuro IR procedure 6/12 Fever, failed SBT  STUDIES: 6/11 cerebral angiogram> occluded Rt MCA inf division branch and R ACA A2 seg; large area of hypoperfusion in the R parietal cortical/subcortical region   LINES / TUBES: 6/11 ETT >> 6/11 R fem arterial sheath >> 6/12  CULTURES: Sputum 6/12 >>  Blood 6/12 >>   ANTIBIOTICS: Unasyn 6/12 >>   SUBJECTIVE:  Tm 102.8.  Failed SBT >> Increased RR.  VITAL SIGNS: Temp:  [99.1 F (37.3 C)-102.8 F (39.3 C)] 102.8 F (39.3 C) (06/12 0438) Pulse Rate:  [69-112] 80 (06/12 0741) Resp:  [16-30] 30 (06/12 0741) BP: (110-191)/(49-103) 145/53 mmHg (06/12 0741) SpO2:  [95 %-100 %] 98 % (06/12 0741) Arterial Line BP: (120-207)/(44-81) 120/44 mmHg (06/12 0700) FiO2 (%):  [40 %] 40 % (06/12 0741) Weight:  [182 lb 8.7 oz (82.8 kg)-183 lb (83.008 kg)] 182 lb 8.7 oz (82.8 kg) (06/11 2306) VENTILATOR SETTINGS: Vent Mode:  [-] CPAP FiO2 (%):  [40 %] 40 % Set Rate:  [16 bmp] 16 bmp Vt Set:  [560 mL] 560 mL PEEP:  [5 cmH20] 5 cmH20 Pressure Support:  [10 cmH20] 10 cmH20 Plateau Pressure:  [16 cmH20-17 cmH20] 16 cmH20 INTAKE / OUTPUT: Intake/Output     06/11 0701 - 06/12 0700 06/12 0701 - 06/13 0700   I.V. (mL/kg) 1395.1 (16.8)    Total Intake(mL/kg) 1395.1 (16.8)    Urine (mL/kg/hr) 540    Total Output 540     Net +855.1             PHYSICAL EXAMINATION:  Gen: Sedated HEENT: Pupils react, ETT in place PULM: scattered rhonchi CV: regular, no murmur AB: BS+, soft, nontender Ext: no edema Derm: chronic venous stasis changes Neuro: RASS -2, no movement of Lt side   LABS:  Recent Labs Lab 09/02/2012 1905 09/06/2012 1908 08/20/2012 1926 08/26/12 0001 08/26/12 0518  HGB 14.2 14.3 15.3  --  12.0*  WBC 10.1  --   --   --  11.8*  PLT 268  --   --   --  233  NA 140 138 140  --  139  K 3.9 4.3 4.2  --  3.7  CL 102 105 104  --  104  CO2 29  --   --   --  25  GLUCOSE 226* 224* 225*  --  181*  BUN 15 16 16   --  15  CREATININE 0.76 0.90 0.90  --  0.82  CALCIUM 9.5  --   --   --  8.3*  AST 11  --   --   --   --   ALT 13  --   --   --   --   ALKPHOS 106  --   --   --   --   BILITOT 0.2*  --   --   --   --  PROT 6.7  --   --   --   --   ALBUMIN 3.4*  --   --   --   --   APTT 28  --   --   --   --   INR 0.94  --   --   --   --   TROPONINI <0.30  --   --   --   --   PHART  --   --   --  7.374  --   PCO2ART  --   --   --  43.8  --   PO2ART  --   --   --  95.4  --     Recent Labs Lab 08/19/2012 0658 09/13/2012 1233 08/31/2012 1904 08/17/2012 2315 08/26/12 0437  GLUCAP 146* 186* 215* 109* 166*    Imaging: Ct Head Wo Contrast  09/02/2012   *RADIOLOGY REPORT*  Clinical Data: Altered mental status.  CT HEAD WITHOUT CONTRAST  Technique:  Contiguous axial images were obtained from the base of the skull through the vertex without contrast.  Comparison: 07/21/2012 and 05/26/2010.  Findings: Motion degraded exam.  Bifrontal encephalomalacia consistent with result of prior trauma.  No intracranial hemorrhage.  Remote left basal ganglia infarct versus prominent perivascular space stable.  Subtle hypodensity posterior right opercular region.  Acute infarct not excluded.  Motion limits evaluation.  No intracranial mass lesion detected on this unenhanced exam.  Vascular calcifications.  Atrophy without hydrocephalus.  Mild polypoid  opacification in the left maxillary sinus.  Mild mucosal thickening left ethmoid sinus air cells.  IMPRESSION: Motion degraded exam.  Subtle hypodensity posterior right opercular region.  Acute infarct not excluded.  Motion limits evaluation.  Bifrontal encephalomalacia result of prior trauma.  Mild polypoid opacification in the left maxillary sinus.  Mild mucosal thickening left ethmoid sinus air cells.   Original Report Authenticated By: Lacy Duverney, M.D.   Portable Chest Xray  08/26/2012   *RADIOLOGY REPORT*  Clinical Data: Cerebral infarction and status post intubation.  PORTABLE CHEST - 1 VIEW  Comparison: 07/07/2012  Findings: Endotracheal tube present with the tip approximately 3.5 cm above the carina.  Lungs show chronic elevation of the right hemidiaphragm with volume loss.  No overt edema, focal consolidation or pleural effusion is identified.  Heart size is stable and at the upper limits of normal.  IMPRESSION: Appropriate positioning of endotracheal tube.  No acute pulmonary abnormalities.   Original Report Authenticated By: Irish Lack, M.D.   Lipid Panel     Component Value Date/Time   CHOL 82 08/26/2012 0518   TRIG 61 08/26/2012 0518   HDL 35* 08/26/2012 0518   CHOLHDL 2.3 08/26/2012 0518   VLDL 12 08/26/2012 0518   LDLCALC 35 08/26/2012 0518      ASSESSMENT / PLAN:  NEUROLOGIC A:  Acute ischemic stroke (R MCA/ACA) s/p tPA >> IR clot removal attempted, but unsuccessful due to location of clot. P:   -further imaging per neurology -add ASA per neuro -goal RASS 0 to -1  PULMONARY A: Post procedure respiratory failure and possible aspiration pneumonia. P:   -full vent support >> defer extubation for now -daily wua/sbt -f/u CXR  CARDIOVASCULAR A: Hx of hypertension, hyperlipidemia, TAA. P:  -prn labetalol -f/u Echo -resume crestor 20 mg qhs >> use lipitor while in hospital -hold lotrel for now  RENAL A:  No acute issues P:   -monitor renal fx, urine outpt,  electrolytes  GASTROINTESTINAL A:  Nutrition. P:   -  PPI for SUP -start tube feeds 6/12   HEMATOLOGIC A:  Post TPA P:  -monitor for bleeding  INFECTIOUS A:  Fever with possible aspiration. Lower extremity venous stasis ulcers. P:   -D1/x unasyn -send sputum, blood cultures 6/12 -wound care following  ENDOCRINE A:  DM type II with hyperglycemia. P:   -SSI -add lantus 10 units and increase as needed -hold metformin for now  GOALS OF CARE >> No CPR/defibrillation, but continue full medical care.  CC time 40 minutes.  Coralyn Helling, MD [facility] Pulmonary/Critical Care 08/26/2012, 8:21 AM Pager:  [phone] After 3pm call: [phone]

## 2012-08-26 NOTE — Consult Note (Signed)
WOC consult Note Reason for Consult: Consult requested for bilat stasis ulcers.   Wound type: Patchy areas of partial thickness wounds to both calves. Pressure Ulcer POA: Yes/No Measurement:  Left posterior leg.2X.2X.1cm, .3X.5X.1cm, .5X.5X.1cm Righ tanterior leg 3X1.5X.1cm, 1.5X.5X.1cm Wound bed: All areas red and moist Drainage (amount, consistency, odor) small yellow drainage, no odor.  Wounds bleed easily when cleansed to remove dry scabbed areas. Periwound: Generalized erythremia to bilat legs Dressing procedure/placement/frequency: Foam dressings to protect and promote healing. Please re-consult if further assistance is needed.  Thank-you,  Cammie Mcgee MSN, RN, CWOCN, [place], CNS [phone]

## 2012-08-26 NOTE — Progress Notes (Signed)
08/26/12  NEURO IR RT 5FR SHEATH REMOVED AT 1215. EXOSEAL DEVICE PLACED TO ACHIEVE HEMOSTASIS AT 1225. DISTAL PULSES INTACT. PRESSURE DRESSING AND SANDBAG APPLIED TO SITE. NO COMPLICATIONS. PT WAS VERY AGITATED AND MOVING BOTH LEGS. SITE REVIEWED WITH RN. CASEY KRIS HINES RT-R Morgann Woodburn RT-R

## 2012-08-26 NOTE — Progress Notes (Signed)
VASCULAR LAB PRELIMINARY  PRELIMINARY  PRELIMINARY  PRELIMINARY  1.  Carotid duplex  completed.    Preliminary report:  Bilateral:  Less than 39% ICA stenosis.  Vertebral artery flow is antegrade.     2.  Bilateral lower extremity duplex completed  Preliminary report:  Bilateral:  No evidence of DVT, superficial thrombosis, or Baker's Cyst.    Maralyn Witherell, RVT 08/26/2012, 12:08 PM

## 2012-08-26 NOTE — Progress Notes (Signed)
SLP Cancellation Note  Patient Details Name: Garrett [name] MRN: [id number] DOB: 01/13/1934   Cancelled treatment:        Pt. Remains on vent and RN stated she does not think he will be extubated today.  ST will follow along.   Breck Coons [place].Ed [employer] [phone]  08/26/2012

## 2012-08-26 NOTE — Progress Notes (Signed)
Patient is currently active with [facility] Care Management for chronic disease management services.  Patient has been engaged by a [employer] and LCSW.  Our community based plan of care has focused on disease management of diabetes, fall risk reduction, and community resource support for ALF placement.  The family has been assisted in engaging [facility] for placement.  If the patient returns to SNF prior to ALF admission this is acceptable for the family.  However, given his current state [facility] is available to assist in conversation with the family for changes in disposition.   Made inpatient Case Manager aware that [facility] Care Management following. Of note, [facility] Care Management services does not replace or interfere with any services that are arranged by inpatient case management or social work.  For additional questions or referrals please contact Anibal Henderson BSN RN [facility] Liaison at [phone].

## 2012-08-26 NOTE — Progress Notes (Signed)
Pulled out fentanyl 100 mcg in pyxis downstairs while in MRI to give patient 50 mcg per order to sedate while in MRI. Went to waste in pyxis but because it was pulled downstairs is not in ours to document against. Will waste in sharps the remainder of the fentanyl and was witnessed by [employer] RN

## 2012-08-26 NOTE — Progress Notes (Signed)
Subjective: Pt remains intubated in Neuro ICU, sedated on Diprivan drip.  Objective: Physical Exam: BP 143/49  Pulse 83  Temp(Src) 102.8 F (39.3 C) (Oral)  Resp 27  Ht 5\' 6"  (1.676 m)  Wt 182 lb 8.7 oz (82.8 kg)  BMI 29.48 kg/m2  SpO2 98% Not responsive with sedation, no movements Rt groin, soft, no ecchymosis, dressing dry, sheath intact Legs warm   Labs: CBC  Recent Labs  09/06/2012 1905  09/02/2012 1926 08/26/12 0518  WBC 10.1  --   --  11.8*  HGB 14.2  < > 15.3 12.0*  HCT 41.3  < > 45.0 34.9*  PLT 268  --   --  233  < > = values in this interval not displayed. BMET  Recent Labs  09/05/2012 1905  09/05/2012 1926 08/26/12 0518  NA 140  < > 140 139  K 3.9  < > 4.2 3.7  CL 102  < > 104 104  CO2 29  --   --  25  GLUCOSE 226*  < > 225* 181*  BUN 15  < > 16 15  CREATININE 0.76  < > 0.90 0.82  CALCIUM 9.5  --   --  8.3*  < > = values in this interval not displayed. LFT  Recent Labs  08/17/2012 1905  PROT 6.7  ALBUMIN 3.4*  AST 11  ALT 13  ALKPHOS 106  BILITOT 0.2*   PT/INR  Recent Labs  08/22/2012 1905  LABPROT 12.5  INR 0.94     Studies/Results: Ct Head Wo Contrast  08/16/2012   *RADIOLOGY REPORT*  Clinical Data: Altered mental status.  CT HEAD WITHOUT CONTRAST  Technique:  Contiguous axial images were obtained from the base of the skull through the vertex without contrast.  Comparison: 07/21/2012 and 05/26/2010.  Findings: Motion degraded exam.  Bifrontal encephalomalacia consistent with result of prior trauma.  No intracranial hemorrhage.  Remote left basal ganglia infarct versus prominent perivascular space stable.  Subtle hypodensity posterior right opercular region.  Acute infarct not excluded.  Motion limits evaluation.  No intracranial mass lesion detected on this unenhanced exam.  Vascular calcifications.  Atrophy without hydrocephalus.  Mild polypoid opacification in the left maxillary sinus.  Mild mucosal thickening left ethmoid sinus air cells.   IMPRESSION: Motion degraded exam.  Subtle hypodensity posterior right opercular region.  Acute infarct not excluded.  Motion limits evaluation.  Bifrontal encephalomalacia result of prior trauma.  Mild polypoid opacification in the left maxillary sinus.  Mild mucosal thickening left ethmoid sinus air cells.   Original Report Authenticated By: Lacy Duverney, M.D.   Dg Chest Port 1 View  08/26/2012   *RADIOLOGY REPORT*  Clinical Data: Possible aspiration pneumonia, follow-up  PORTABLE CHEST - 1 VIEW  Comparison: Portable chest x-ray of 08/24/2012  Findings: No active infiltrate or effusion is seen.  Heart size is stable.  No bony abnormality is seen.  The endotracheal tube is unchanged in position.  IMPRESSION: No interval change.  The lungs remain clear.  Endotracheal tube is unchanged in position.   Original Report Authenticated By: Dwyane Dee, M.D.   Portable Chest Xray  08/26/2012   *RADIOLOGY REPORT*  Clinical Data: Cerebral infarction and status post intubation.  PORTABLE CHEST - 1 VIEW  Comparison: 07/07/2012  Findings: Endotracheal tube present with the tip approximately 3.5 cm above the carina.  Lungs show chronic elevation of the right hemidiaphragm with volume loss.  No overt edema, focal consolidation or pleural effusion is identified.  Heart  size is stable and at the upper limits of normal.  IMPRESSION: Appropriate positioning of endotracheal tube.  No acute pulmonary abnormalities.   Original Report Authenticated By: Irish Lack, M.D.    Assessment/Plan: Occluded RT MCA inf divison angular branch ,and RT ACA A2 seg .Large area of hypoperfusion in the Rt parietal cortical/subcortical region No intervention Will remove sheath. Other plans per Neuro/PCCM     LOS: 1 day    Brayton El PA-C 08/26/2012 8:39 AM

## 2012-08-26 NOTE — Progress Notes (Signed)
In to visit with patient at bedside. He remains intubated and resting comfortably. No family noted at bedside.  Left contact information at bedside. Requested staff RN to inform family of [facility] visit.  Will continue to monitor. Of note, [facility] Care Management services does not replace or interfere with any services that are arranged by inpatient case management or social work.  For additional questions or referrals please contact Anibal Henderson BSN RN [facility] Liaison at [phone].

## 2012-08-26 NOTE — Progress Notes (Signed)
Stroke Team Progress Note  HISTORY Garrett [name] is a 79 y.o. male resident of [facility] who was last seen normal at 4 PM on 09/07/2012 and then was found around 5pm and presented to [facility] [place] with left sided weakness. Telestroke was activated, but after a delay, the patient was discussed with Dr. Amada Jupiter over the phone and the decision was made to give tPA within the 4.5 hour window.   He was transferred to [facility] for further evaluation and possible intervention. He has previously stated that in the event of a cardiac arrest, he would not want resuscitation but has specifying that he would want intubation for procedures. After discussing with his daughter, the decision was made to go ahead with intervention and he was taken to IR.  He was in a facility undergoing rehab for hip surgery(performed 2 months ago)   SUBJECTIVE His son is at the bedside.  Overall he feels his condition is stable. Plans in place to get sheaths removed.  OBJECTIVE Most recent Vital Signs: Filed Vitals:   08/26/12 0600 08/26/12 0700 08/26/12 0741 08/26/12 0800  BP: 136/51 110/49 145/53 143/49  Pulse: 82 79 80 83  Temp:      TempSrc:      Resp: 19 17 30 27   Height:      Weight:      SpO2: 98% 98% 98% 98%   CBG (last 3)   Recent Labs  08/30/2012 1904 08/22/2012 2315 08/26/12 0437  GLUCAP 215* 109* 166*    IV Fluid Intake:   . sodium chloride    . sodium chloride 75 mL/hr at 09/10/2012 2355  . propofol 30 mcg/kg/min (08/26/12 0427)    MEDICATIONS  . ampicillin-sulbactam (UNASYN) IV  3 g Intravenous Q8H  . antiseptic oral rinse  15 mL Mouth Rinse QID  . atorvastatin  40 mg Per Tube q1800  . chlorhexidine  15 mL Mouth Rinse BID  . Chlorhexidine Gluconate Cloth  6 each Topical Q0600  . insulin aspart  0-15 Units Subcutaneous Q4H  . insulin glargine  10 Units Subcutaneous QHS  . mupirocin ointment  1 application Nasal BID  . pantoprazole sodium  40 mg Per Tube Q24H  .  propofol       PRN:  acetaminophen, acetaminophen, fentaNYL, labetalol, labetalol  Diet:  NPO  Activity:  Bedrest DVT Prophylaxis:  SCDs ordered  CLINICALLY SIGNIFICANT STUDIES Basic Metabolic Panel:  Recent Labs Lab 08/31/2012 1905  08/16/2012 1926 08/26/12 0518  NA 140  < > 140 139  K 3.9  < > 4.2 3.7  CL 102  < > 104 104  CO2 29  --   --  25  GLUCOSE 226*  < > 225* 181*  BUN 15  < > 16 15  CREATININE 0.76  < > 0.90 0.82  CALCIUM 9.5  --   --  8.3*  < > = values in this interval not displayed. Liver Function Tests:  Recent Labs Lab 08/24/2012 1905  AST 11  ALT 13  ALKPHOS 106  BILITOT 0.2*  PROT 6.7  ALBUMIN 3.4*   CBC:  Recent Labs Lab 08/22/2012 1905  09/08/2012 1926 08/26/12 0518  WBC 10.1  --   --  11.8*  NEUTROABS 7.9*  --   --  10.1*  HGB 14.2  < > 15.3 12.0*  HCT 41.3  < > 45.0 34.9*  MCV 87.9  --   --  86.8  PLT 268  --   --  233  < > = values in this interval not displayed. Coagulation:  Recent Labs Lab 08/24/2012 1905  LABPROT 12.5  INR 0.94   Cardiac Enzymes:  Recent Labs Lab 08/30/2012 1905  TROPONINI <0.30   Urinalysis:  Recent Labs Lab 08/17/2012 1918  COLORURINE YELLOW  LABSPEC 1.015  PHURINE 6.0  GLUCOSEU >1000*  HGBUR NEGATIVE  BILIRUBINUR NEGATIVE  KETONESUR NEGATIVE  PROTEINUR NEGATIVE  UROBILINOGEN 0.2  NITRITE NEGATIVE  LEUKOCYTESUR NEGATIVE   Lipid Panel    Component Value Date/Time   CHOL 82 08/26/2012 0518   TRIG 61 08/26/2012 0518   HDL 35* 08/26/2012 0518   CHOLHDL 2.3 08/26/2012 0518   VLDL 12 08/26/2012 0518   LDLCALC 35 08/26/2012 0518   HgbA1C  Lab Results  Component Value Date   HGBA1C 10.0* 07/07/2012    Urine Drug Screen:     Component Value Date/Time   LABOPIA NONE DETECTED 09/13/2012 1918   COCAINSCRNUR NONE DETECTED 09/05/2012 1918   LABBENZ NONE DETECTED 08/22/2012 1918   AMPHETMU NONE DETECTED 08/21/2012 1918   THCU NONE DETECTED 08/27/2012 1918   LABBARB NONE DETECTED 08/29/2012 1918    Alcohol Level:   Recent Labs Lab 08/24/2012 1905  ETH <11   CT of the brain  09/07/2012   Motion degraded exam.  Subtle hypodensity posterior right opercular region.  Acute infarct not excluded.  Motion limits evaluation.  Bifrontal encephalomalacia result of prior trauma.  Mild polypoid opacification in the left maxillary sinus.  Mild mucosal thickening left ethmoid sinus air cells.   Cerebral angiogram S/P bilateral common carotid arteriograms and lt vertebral arteriogram. Rt CFA approach.  Findings:  1.Occluded RT MCA inf divison angular branch and RT ACA A2 seg . 2.Large area of hypoperfusion in the Rt parietal cortical/subcortical region    MRI of the brain    MRA of the brain    2D Echocardiogram    Carotid Doppler    CXR   08/26/2012    No interval change.  The lungs remain clear.  Endotracheal tube is unchanged in position.    08/26/2012    Appropriate positioning of endotracheal tube.  No acute pulmonary abnormalities.   EKG  normal sinus rhythm.   Therapy Recommendations   Physical Exam   Elderly male intubated and sedated on propofol drip . Afebrile. Head is nontraumatic. Neck is supple without bruit.   Cardiac exam no murmur or gallop. Lungs are clear to auscultation. Distal pulses are well felt.has groin arterial sheath in place. Neurological Exam : sedated and lethargic. Partially opens eyes to sternal rub.. Eyes are in pimary position. Dolls eye movements are sluggish to the left but not to the right. Pupils equal reactive. Fundi were not visualized. Vision acuity and fields cannot be reliably tested. Face is symmetric. Tongue is midline. Withdraws the right side purposefully against gravity briskly to painful stimuli. Withdraws the left side less briskly to pain and not against gravity. Tone is decreased on the left. There is moderate left-sided weakness. Right plantar is downgoing left is upgoing. Gait was not tested. ASSESSMENT Mr. Garrett [name] is a 78 y.o. male presenting with left  hemiparesis at [facility]. Status post IV t-PA 09/05/2012 at 2015 at [facility], transferred to [facility] where cerebral angio was performed which showed a distal R MCA inferior division brain clot not amenable to intervneion. Patient with right brain cortical and subcortical infarct, MRI pending. Infarct felt to be embolic secondary to unknown source.  On no antithrombotics prior  to admission. Now on no antithrombotics as within 24h of tpa administration for secondary stroke prevention. Patient with resultant VDRF, left hemiparesis, left facial droop, dysphagia, lethargy. Work up underway.   Post neuro intervention respiratory failure  Febrile, 102.5, ice packs on, Presumed aspiration pneumonia, on unasyn Hypertension Hyperlipidemia, LDL 35, on crestor 20 PTA, now on lipitor in the hospital, goal LDL < 100 (< 70 for diabetics) Diabetes, HgbA1c 10.0 in April 2014, goal < 7.0 Hx SDH following fall March 2012 Right hip fracture 2 mos ago, no surgical intervention, currently in [facility] undergoing rehab, on 08/17/2012 was cleared for 50% NWB x 2 wks then progress. Was independent, living alone prior to hip fx thoaracic aortic aneurysm without rupture Bilateral stasis ulcers bilateral calves  Hospital day # 1  TREATMENT/PLAN  Add aspirin for secondary stroke prevention if imaging negative for hemorrhage 24h post tpa. First dose to be given prior to 12 midnight tonight  Check LE dopplers given recent hip fx and limited mobility  CCM manage vent, extubation  Goal to normalize temperature for optimal stroke outcome  F/u imaging, stroke workup  Dr. Pearlean Brownie discussed diagnosis, prognosis,  treatment options and plan of care with  Son and Dr. Craige Cotta.  Annie Main, MSN, RN, ANVP-BC, ANP-BC, [facility] Pager: [phone] 08/26/2012 8:42 AM This patient is critically ill and at significant risk of neurological worsening, death and care requires constant monitoring of vital signs,  hemodynamics,respiratory and cardiac monitoring,review of multiple databases, neurological assessment, discussion with family, other specialists and medical decision making of high complexity. I spent 30 minutes of neurocritical care time  in the care of  this patient. I have personally obtained a history, examined the patient, evaluated imaging results, and formulated the assessment and plan of care. I agree with the above. Delia Heady, MD

## 2012-08-26 NOTE — Progress Notes (Signed)
Per RN, MD placed pt back on full support.

## 2012-08-26 NOTE — Progress Notes (Signed)
Pt transported to MRI and placed on MRI vent. Transported back to ICU with no complications noted.

## 2012-08-26 NOTE — Consult Note (Signed)
ANTIBIOTIC CONSULT NOTE - INITIAL  Pharmacy Consult for Unasyn Indication: pneumonia  No Known Allergies  Patient Measurements: Height: 5\' 6"  (167.6 cm) Weight: 182 lb 8.7 oz (82.8 kg) IBW/kg (Calculated) : 63.8  Vital Signs: Temp: 102.8 F (39.3 C) (06/12 0438) Temp src: Oral (06/12 0438) BP: 145/53 mmHg (06/12 0741) Pulse Rate: 80 (06/12 0741) Intake/Output from previous day: 06/11 0701 - 06/12 0700 In: 1395.1 [I.V.:1395.1] Out: 540 [Urine:540] Intake/Output from this shift:    Labs:  Recent Labs  09/11/12 1905 09/11/2012 1908 09/11/2012 1926 08/26/12 0518  WBC 10.1  --   --  11.8*  HGB 14.2 14.3 15.3 12.0*  PLT 268  --   --  233  CREATININE 0.76 0.90 0.90 0.82   Estimated Creatinine Clearance: 77.4 ml/min (by C-G formula based on Cr of 0.82).  Microbiology: Recent Results (from the past 720 hour(s))  MRSA PCR SCREENING     Status: Abnormal   Collection Time    2012/09/11 11:09 PM      Result Value Range Status   MRSA by PCR POSITIVE (*) NEGATIVE Final   Comment:            The GeneXpert MRSA Assay (FDA     approved for NASAL specimens     only), is one component of a     comprehensive MRSA colonization     surveillance program. It is not     intended to diagnose MRSA     infection nor to guide or     monitor treatment for     MRSA infections.     RESULT CALLED TO, READ BACK BY AND VERIFIED WITH:     KMAYRON(RN)BY [facility] 08/26/2012 AT 1:36AM    Medical History: Past Medical History  Diagnosis Date  . Diabetes mellitus without complication   . Hypertension   . Hypercholesterolemia   . SDH (subdural hematoma) 05/2010    due to fall  . Thoracic aortic aneurysm without rupture    Assessment: 78yom admitted with acute stroke spiked a fever this morning to 102.8. He will begin empiric unasyn for possible aspiration pneumonia. Renal function is stable with CrCl ~39ml/min.  Goal of Therapy:  Appropriate unasyn dosing  Plan:  1) Unasyn 3g IV q8 2)  Follow renal function, cultures, clinical status  Fredrik Rigger 08/26/2012,8:11 AM

## 2012-08-26 NOTE — Progress Notes (Signed)
Echo Lab  2D Echocardiogram completed.  Latoyia Tecson L Jaleeya Mcnelly, RDCS 08/26/2012 11:39 AM \

## 2012-08-26 NOTE — Progress Notes (Signed)
PT Cancellation Note  Patient Details Name: Garrett [name] MRN: [id number] DOB: 1933-10-20   Cancelled Treatment:    Reason Eval/Treat Not Completed: Medical issues which prohibited therapy;Patient not medically ready; patient still intubated and on bedrest all day s/p sheath removal.  Will attempt tomorrow.   WYNN,CYNDI 08/26/2012, 12:40 PM

## 2012-08-27 ENCOUNTER — Inpatient Hospital Stay (HOSPITAL_COMMUNITY): Payer: Medicare Other

## 2012-08-27 ENCOUNTER — Encounter (HOSPITAL_COMMUNITY): Payer: Self-pay | Admitting: Radiology

## 2012-08-27 DIAGNOSIS — R269 Unspecified abnormalities of gait and mobility: Secondary | ICD-10-CM

## 2012-08-27 DIAGNOSIS — I619 Nontraumatic intracerebral hemorrhage, unspecified: Secondary | ICD-10-CM

## 2012-08-27 DIAGNOSIS — G936 Cerebral edema: Secondary | ICD-10-CM

## 2012-08-27 LAB — GLUCOSE, CAPILLARY
Glucose-Capillary: 121 mg/dL — ABNORMAL HIGH (ref 70–99)
Glucose-Capillary: 206 mg/dL — ABNORMAL HIGH (ref 70–99)
Glucose-Capillary: 221 mg/dL — ABNORMAL HIGH (ref 70–99)
Glucose-Capillary: 240 mg/dL — ABNORMAL HIGH (ref 70–99)

## 2012-08-27 LAB — BASIC METABOLIC PANEL
Calcium: 8.3 mg/dL — ABNORMAL LOW (ref 8.4–10.5)
GFR calc non Af Amer: 88 mL/min — ABNORMAL LOW (ref >90)
Glucose, Bld: 193 mg/dL — ABNORMAL HIGH (ref 70–99)
Sodium: 139 mEq/L (ref 135–145)

## 2012-08-27 LAB — CBC
Hemoglobin: 11.5 g/dL — ABNORMAL LOW (ref 13.0–17.0)
MCH: 29.9 pg (ref 26.0–34.0)
MCHC: 33.9 g/dL (ref 30.0–36.0)
Platelets: 174 10*3/uL (ref 150–400)
RDW: 13.5 % (ref 11.5–15.5)

## 2012-08-27 MED ORDER — ACETAMINOPHEN 325 MG PO TABS
650.0000 mg | ORAL_TABLET | ORAL | Status: DC | PRN
  Administered 2012-08-27 – 2012-08-30 (×5): 650 mg
  Filled 2012-08-27 (×5): qty 2

## 2012-08-27 MED ORDER — INSULIN GLARGINE 100 UNIT/ML ~~LOC~~ SOLN
15.0000 [IU] | Freq: Every day | SUBCUTANEOUS | Status: DC
  Administered 2012-08-27: 15 [IU] via SUBCUTANEOUS
  Filled 2012-08-27 (×2): qty 0.15

## 2012-08-27 MED ORDER — ACETAMINOPHEN 650 MG RE SUPP: 650.0000 mg | RECTAL | Status: DC | PRN

## 2012-08-27 NOTE — Progress Notes (Signed)
Stroke Team Progress Note  HISTORY Garrett [name] is a 79 y.o. male resident of [facility] who was last seen normal at 4 PM on  and then was found around 5pm and presented to [facility] [place] with left sided weakness. Telestroke was activated, but after a delay, the patient was discussed with Dr. Amada Jupiter over the phone and the decision was made to give tPA within the 4.5 hour window.   He was transferred to [facility] for further evaluation and possible intervention. He has previously stated that in the event of a cardiac arrest, he would not want resuscitation but has specifying that he would want intubation for procedures. After discussing with his daughter, the decision was made to go ahead with intervention and he was taken to IR.  He was in a facility undergoing rehab for hip surgery(performed 2 months ago)   SUBJECTIVE Feels stable.Family in room.  OBJECTIVE Most recent Vital Signs: Filed Vitals:   08/27/12 0800 08/27/12 0900 08/27/12 1000 08/27/12 1100  BP: 119/48 117/49 134/52 137/56  Pulse: 56 54 60 60  Temp: 99.5 F (37.5 C)     TempSrc: Oral     Resp: 16 16 17 18   Height:      Weight:      SpO2: 99% 98% 99% 99%   CBG (last 3)   Recent Labs  08/27/12 0004 08/27/12 0410 08/27/12 0756  GLUCAP 206* 162* 203*    IV Fluid Intake:   . sodium chloride 30 mL/hr at 08/27/12 1100  . propofol 29.92 mcg/kg/min (08/27/12 1100)    MEDICATIONS  . ampicillin-sulbactam (UNASYN) IV  3 g Intravenous Q8H  . antiseptic oral rinse  15 mL Mouth Rinse QID  . atorvastatin  40 mg Per Tube q1800  . chlorhexidine  15 mL Mouth Rinse BID  . Chlorhexidine Gluconate Cloth  6 each Topical Q0600  . feeding supplement (JEVITY 1.2 CAL)  1,000 mL Per Tube Q24H  . feeding supplement  60 mL Per Tube TID  . insulin aspart  0-15 Units Subcutaneous Q4H  . insulin glargine  15 Units Subcutaneous QHS  . multivitamin with minerals  1 tablet Per Tube Daily  . mupirocin  ointment  1 application Nasal BID  . pantoprazole sodium  40 mg Per Tube Q24H   PRN:  acetaminophen, acetaminophen, fentaNYL, labetalol  Diet:    NPO Activity:  Bedrest DVT Prophylaxis:  SCDs ordered  CLINICALLY SIGNIFICANT STUDIES Basic Metabolic Panel:   Recent Labs Lab 08/26/12 0518 08/27/12 0600  NA 139 139  K 3.7 3.4*  CL 104 106  CO2 25 26  GLUCOSE 181* 193*  BUN 15 22  CREATININE 0.82 0.73  CALCIUM 8.3* 8.3*   Liver Function Tests:   Recent Labs Lab 08/24/2012 1905  AST 11  ALT 13  ALKPHOS 106  BILITOT 0.2*  PROT 6.7  ALBUMIN 3.4*   CBC:  Recent Labs Lab 08/24/2012 1905  08/26/12 0518 08/27/12 0600  WBC 10.1  --  11.8* 12.2*  NEUTROABS 7.9*  --  10.1*  --   HGB 14.2  < > 12.0* 11.5*  HCT 41.3  < > 34.9* 33.9*  MCV 87.9  --  86.8 88.3  PLT 268  --  233 174  < > = values in this interval not displayed. Coagulation:   Recent Labs Lab 08/31/2012 1905  LABPROT 12.5  INR 0.94   Cardiac Enzymes:   Recent Labs Lab 08/18/2012 1905  TROPONINI <0.30  Urinalysis:   Recent Labs Lab 09/04/2012 1918  COLORURINE YELLOW  LABSPEC 1.015  PHURINE 6.0  GLUCOSEU >1000*  HGBUR NEGATIVE  BILIRUBINUR NEGATIVE  KETONESUR NEGATIVE  PROTEINUR NEGATIVE  UROBILINOGEN 0.2  NITRITE NEGATIVE  LEUKOCYTESUR NEGATIVE   Lipid Panel    Component Value Date/Time   CHOL 82 08/26/2012 0518   TRIG 61 08/26/2012 0518   HDL 35* 08/26/2012 0518   CHOLHDL 2.3 08/26/2012 0518   VLDL 12 08/26/2012 0518   LDLCALC 35 08/26/2012 0518   HgbA1C  Lab Results  Component Value Date   HGBA1C 8.5* 08/26/2012    Urine Drug Screen:     Component Value Date/Time   LABOPIA NONE DETECTED 08/19/2012 1918   COCAINSCRNUR NONE DETECTED 08/20/2012 1918   LABBENZ NONE DETECTED 08/19/2012 1918   AMPHETMU NONE DETECTED 08/17/2012 1918   THCU NONE DETECTED 08/19/2012 1918   LABBARB NONE DETECTED 09/05/2012 1918    Alcohol Level:   Recent Labs Lab 08/24/2012 1905  ETH <11   CT of the brain       Motion degraded exam.  Subtle hypodensity posterior right opercular region.  Acute infarct not excluded.  Motion limits evaluation.  Bifrontal encephalomalacia result of prior trauma.  Mild polypoid opacification in the left maxillary sinus.  Mild mucosal thickening left ethmoid sinus air cells.  08/27/2012 Significant interval progression of changes since previous study with large infarction not demonstrated in the right cerebral hemisphere. Interval development of increased edema and mass effect with 8 mm right-to-left midline shift. Interval development of intraparenchymal and subarachnoid hemorrhage in the right cerebral hemisphere and focally in the left convexity. Intraventricular hemorrhage in the left lateral ventricle  Cerebral angiogram S/P bilateral common carotid arteriograms and lt vertebral arteriogram. Rt CFA approach.  Findings:  1.Occluded RT MCA inf divison angular branch and RT ACA A2 seg . 2.Large area of hypoperfusion in the Rt parietal cortical/subcortical region    MRI of the brain   Large combined right MCA and ACA acute infarct, some sparing of the anterior right MCA territory.  Malignant hemorrhagic transformation of the infarct. Parenchymal and subarachnoid hemorrhage present. Small volume of  intraventricular hemorrhage.Effacement of the right lateral ventricle and leftward midline shift of 8 mm. Basilar cisterns patent at this time  MRA of the brain  Posterior division right MCA branches appear diminutive with decreased flow signal compared to those on the left, but no proximal or major right MCA branch occlusion is identified. Attenuation of the right ACA A2 segment which appears to occlude at the level of the pericallosal artery. Anterior circulation elsewhere is within normal limits. Posterior circulation atherosclerosis without high-grade stenosis or major branch occlusion.  2D Echocardiogram  EF 50% with diffuse HK  Carotid Doppler  Preliminary report:  Bilateral: Less than 39% ICA stenosis. Vertebral artery flow is antegrade.   LE  Venous Dopplers: Bilateral: No evidence of DVT, superficial thrombosis, or Baker's Cyst.  CXR   08/26/2012    No interval change.  The lungs remain clear.  Endotracheal tube is unchanged in position.    08/26/2012    Appropriate positioning of endotracheal tube.  No acute pulmonary abnormalities.   EKG  normal sinus rhythm.   Therapy Recommendations PENDING  Physical Exam   Elderly male intubated and sedated on propofol drip . Afebrile. Head is nontraumatic. Neck is supple without bruit.   Cardiac exam no murmur or gallop. Lungs are clear to auscultation. Distal pulses are well felt.has groin arterial sheath in place. Neurological  Exam : sedated and lethargic. Partially opens eyes to sternal rub.. Eyes are in primary position. Dolls eye movements are sluggish to the left but not to the right. Pupils equal reactive. Fundi were not visualized. Vision acuity and fields cannot be reliably tested. Face is symmetric. Tongue is midline. Withdraws the right side purposefully against gravity briskly to painful stimuli. Decerebrate posturing of the the left side  to pain  Tone is decreased on the left. There is moderate left-sided weakness. Right plantar is downgoing left is upgoing. Gait was not tested.  ASSESSMENT Mr. Garrett [name] is a 79 y.o. male presenting with left hemiparesis at [facility]. Status post IV t-PA Sep 03, 2012 at 2015 at [facility], transferred to [facility] where cerebral angio was performed which showed a distal R MCA inferior division brain clot not amenable to intervention. Patient with right brain cortical and subcortical infarct.  Infarct felt to be embolic secondary to unknown source.  On no antithrombotics prior to admission, patient received tPA. After 12 hours post tPA patient now on no antithrombotics due to transformation hemorrhage, subarachnoid hemorrhage within 24h of tpa administration for secondary  stroke prevention. Patient with resultant VDRF, left hemiparesis, left facial droop, dysphagia, lethargy. Work up underway.   Post neuro intervention respiratory failure  Febrile,  99.5;  Presumed aspiration pneumonia, on unasyn Hypertension Hyperlipidemia, LDL 35, on crestor 20 PTA, now on lipitor in the hospital, goal LDL < 100 (< 70 for diabetics) Diabetes, HgbA1c 10.0 in April 2014, goal < 7.0 Hx SDH following fall March 2012 Right hip fracture 2 mos ago, no surgical intervention, currently in [facility] undergoing rehab, on 08/17/2012 was cleared for 50% NWB x 2 wks then progress. 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   Hospital day # 2  TREATMENT/PLAN  No antitthrombotics given hemorrhagic transformations  CCM manage vent, extubation  Goal to normalize temperature for optimal stroke outcome  No CPR/defibrillation, but continue full medical care  Risk factor modification   Prognosis poor and likely need trach/peg/SNF and son to decide soon about this  Dr. Pearlean Brownie discussed diagnosis, prognosis, treatment options and plan of care with son and Dr. Stephens Shire D. Manson Passey, [facility], MBA, MHA [facility] Pager: [phone] 08/27/2012 11:28 AM   This patient is critically ill and at significant risk of neurological worsening, death and care requires constant monitoring of vital signs, hemodynamics,respiratory and cardiac monitoring,review of multiple databases, neurological assessment, discussion with family, other specialists and medical decision making of high complexity. I spent 30 minutes of neurocritical care time  in the care of  this  patient.  I have personally obtained a history, examined the patient, evaluated imaging results, and formulated the assessment and plan of care. I agree with the above.

## 2012-08-27 NOTE — Progress Notes (Signed)
PULMONARY  / CRITICAL CARE MEDICINE  Name: CARROLL LINGELBACH MRN: [id number] DOB: [birth year]    ADMISSION DATE:  08/30/2012 CONSULTATION DATE:  09/05/2012  REFERRING MD :  Amada Jupiter PRIMARY SERVICE: Neurology  CHIEF COMPLAINT:  Stroke, Vent management  BRIEF PATIENT DESCRIPTION:  79 yo male former smoker presented to [facility] with altered mental status with Lt facial droop and Lt sided weakness >> given tPA w/o improvement.  Transferred to [facility] for tx with neuro IR >> intubated for procedure, and PCCM consulted to assist with vent/medical management.   SIGNIFICANT EVENTS:  6/11 [facility] ED for stroke symptoms > tpa, neuro IR procedure 6/12 Fever, failed SBT  STUDIES: 6/11 cerebral angiogram> occluded Rt MCA inf division branch and R ACA A2 seg; large area of hypoperfusion in the R parietal cortical/subcortical region  6/12 Echo >> EF 50%, diffuse hypokinesis, grade 1 diastolic dysfx 6/12 MRI brain >> Large ACA/MCA infarct with hemorrhagic transformation and SAH, 8 mm midline shift 6/12 CT head >> increased edema and mass effect with 8 mm Rt to Lt shift, SAH  LINES / TUBES: 6/11 ETT >> 6/11 R fem arterial sheath >> 6/12  CULTURES: Sputum 6/12 >>  Blood 6/12 >>   ANTIBIOTICS: Unasyn 6/12 >>   SUBJECTIVE:  Tm 101.9.  Decreased secretions.  VITAL SIGNS: Temp:  [99.2 F (37.3 C)-101.9 F (38.8 C)] 99.5 F (37.5 C) (06/13 0800) Pulse Rate:  [54-77] 54 (06/13 0900) Resp:  [16-24] 16 (06/13 0900) BP: (95-162)/(32-67) 117/49 mmHg (06/13 0900) SpO2:  [97 %-100 %] 98 % (06/13 0900) Arterial Line BP: (107-170)/(46-61) 107/46 mmHg (06/12 1200) FiO2 (%):  [40 %] 40 % (06/13 0900) VENTILATOR SETTINGS: Vent Mode:  [-] PRVC FiO2 (%):  [40 %] 40 % Set Rate:  [16 bmp] 16 bmp Vt Set:  [560 mL] 560 mL PEEP:  [5 cmH20-8 cmH20] 5 cmH20 Plateau Pressure:  [14 cmH20-20 cmH20] 18 cmH20 INTAKE / OUTPUT: Intake/Output     06/12 0701 - 06/13 0700 06/13 0701 - 06/14 0700   I.V. (mL/kg) 2200.9 (26.6)  172.4 (2.1)   NG/GT 240 60   IV Piggyback 300    Total Intake(mL/kg) 2740.9 (33.1) 232.4 (2.8)   Urine (mL/kg/hr) 1110 (0.6)    Total Output 1110     Net +1630.9 +232.4          PHYSICAL EXAMINATION:  Gen: Sedated HEENT: OG, ETT in place PULM: scattered rhonchi CV: regular, no murmur AB: BS+, soft, nontender Ext: no edema Derm: chronic venous stasis changes Neuro: RASS -2   LABS:  Recent Labs Lab 09/06/2012 1905  08/22/2012 1926 08/26/12 0001 08/26/12 0518 08/27/12 0600  HGB 14.2  < > 15.3  --  12.0* 11.5*  WBC 10.1  --   --   --  11.8* 12.2*  PLT 268  --   --   --  233 174  NA 140  < > 140  --  139 139  K 3.9  < > 4.2  --  3.7 3.4*  CL 102  < > 104  --  104 106  CO2 29  --   --   --  25 26  GLUCOSE 226*  < > 225*  --  181* 193*  BUN 15  < > 16  --  15 22  CREATININE 0.76  < > 0.90  --  0.82 0.73  CALCIUM 9.5  --   --   --  8.3* 8.3*  AST 11  --   --   --   --   --  ALT 13  --   --   --   --   --   ALKPHOS 106  --   --   --   --   --   BILITOT 0.2*  --   --   --   --   --   PROT 6.7  --   --   --   --   --   ALBUMIN 3.4*  --   --   --   --   --   APTT 28  --   --   --   --   --   INR 0.94  --   --   --   --   --   TROPONINI <0.30  --   --   --   --   --   PHART  --   --   --  7.374  --   --   PCO2ART  --   --   --  43.8  --   --   PO2ART  --   --   --  95.4  --   --   < > = values in this interval not displayed.  Recent Labs Lab 08/26/12 1648 08/26/12 2027 08/27/12 0004 08/27/12 0410 08/27/12 0756  GLUCAP 169* 193* 206* 162* 203*    Imaging: Ct Head Wo Contrast  08/27/2012   *RADIOLOGY REPORT*  Clinical Data: Follow-up stroke.  CT HEAD WITHOUT CONTRAST  Technique:  Contiguous axial images were obtained from the base of the skull through the vertex without contrast.  Comparison: CT head September 06, 2012.  MRI brain 08/26/2012.  Findings: Interval progression of low attenuation changes which are now seen throughout the right cerebral hemisphere consistent with  infarcts in the distribution of the anterior and middle cerebral arteries as shown on previous MRI.  Since the previous CT study, the area of low attenuation has increased significantly and there is increased edema.  There is increased mass effect with effacement of the right sided sulci and effacement the right lateral ventricle.  There is midline shift from right to left of approximately 8 mm.  There is interval development of multi focal acute hemorrhage with hemorrhagic foci demonstrated in the areas of infarct in the right anterior frontal, right posterior frontal, right parietal, and temporal regions as well as in the left convexity.  Layering hemorrhage is present in the left lateral ventricle.  There is mild effacement of the right side of the basal cisterns.  IMPRESSION: Significant interval progression of changes since previous study with large infarction not demonstrated in the right cerebral hemisphere.  Interval development of increased edema and mass effect with 8 mm right-to-left midline shift.  Interval development of intraparenchymal and subarachnoid hemorrhage in the right cerebral hemisphere and focally in the left convexity. Intraventricular hemorrhage in the left lateral ventricle.  Results were telephoned to the patient's nurse, Loraine Leriche in the Neuro of ICU at 0304 hours on 08/27/2012.   Original Report Authenticated By: Burman Nieves, M.D.   Ct Head Wo Contrast  09/06/2012   *RADIOLOGY REPORT*  Clinical Data: Altered mental status.  CT HEAD WITHOUT CONTRAST  Technique:  Contiguous axial images were obtained from the base of the skull through the vertex without contrast.  Comparison: 07/21/2012 and 05/26/2010.  Findings: Motion degraded exam.  Bifrontal encephalomalacia consistent with result of prior trauma.  No intracranial hemorrhage.  Remote left basal ganglia infarct versus prominent perivascular space stable.  Subtle hypodensity posterior right opercular  region.  Acute infarct not  excluded.  Motion limits evaluation.  No intracranial mass lesion detected on this unenhanced exam.  Vascular calcifications.  Atrophy without hydrocephalus.  Mild polypoid opacification in the left maxillary sinus.  Mild mucosal thickening left ethmoid sinus air cells.  IMPRESSION: Motion degraded exam.  Subtle hypodensity posterior right opercular region.  Acute infarct not excluded.  Motion limits evaluation.  Bifrontal encephalomalacia result of prior trauma.  Mild polypoid opacification in the left maxillary sinus.  Mild mucosal thickening left ethmoid sinus air cells.   Original Report Authenticated By: Lacy Duverney, M.D.   Mr Brain Wo Contrast  08/26/2012   *RADIOLOGY REPORT*  Clinical Data:  79 year old male status post 09/10/2012 intravenous t-PA, subsequent NIR evaluation.  Code stroke presenting with signs and symptoms of right MCA infarct.  Comparison: Cerebral angiogram 08/23/2012.  Head CT without contrast 09/13/2012.  MRI HEAD WITHOUT CONTRAST  Technique: Multiplanar, multiecho pulse sequences of the brain and surrounding structures were obtained according to standard protocol without intravenous contrast.  Findings: Restricted diffusion in the right hemisphere with dense right ACA and posterior right MCA involvement.  Sub total involvement of the right basal ganglia.  Some sparing of the right MCA anterior division.  Right PCA territory is spared.  Associated multi focal right hemisphere parenchymal hemorrhages (series 9 image 16, 12) scattered in the affected areas. The largest of these are up to 5 cm diameter.  There is also associated subarachnoid hemorrhage visible on both T2* and FLAIR imaging. There is a small volume of intraventricular hemorrhage layering in the left occipital horn.  Subsequent leftward midline shift of 8 mm.  Associated effacement of the right lateral ventricle.  No ventriculomegaly at this time. Basilar cisterns are patent.  Small focus of restricted diffusion in the left  cingulate gyrus on series 5 image 15.  Mild diffusion abnormality associated with the subarachnoid hemorrhage.  No other left hemisphere restricted diffusion.  No posterior fossa restricted diffusion.  Major intracranial vascular flow voids the are preserved.  MRA findings are below.  Chronic encephalomalacia anterior inferior left frontal lobe. Pituitary, cervicomedullary junction and visualized cervical spine are within normal limits.  Normal bone marrow signal.  Fluid in the pharynx.  An enteric type tube is looped in the nasopharynx.  Paranasal sinus mucosal thickening and fluid.  Mastoids are clear. Visualized orbit soft tissues are within normal limits.  Negative scalp soft tissues.  IMPRESSION: 1.  Large combined right MCA and ACA acute infarct, some sparing of the anterior right MCA territory. 2.  Malignant hemorrhagic transformation of the infarct. Parenchymal and subarachnoid hemorrhage present.  Small volume of intraventricular hemorrhage. 3.  Effacement of the right lateral ventricle and leftward midline shift of 8 mm.  Basilar cisterns patent at this time. 4.  Oro-enteric tube looped in the nasopharynx, recommend repositioning.  (Called to [phone] Nurse Monica at the time of this report) 5.  See MRA findings below.  Critical Value/emergent results were called by telephone at the time of interpretation on 08/26/2012 at 1645 hours to provider Annie Main, who verbally acknowledged these results.  MRA HEAD WITHOUT CONTRAST  Technique: Angiographic images of the Circle of Willis were obtained using MRA technique without  intravenous contrast.  Findings: Study is mildly 1645 hours degraded by motion artifact despite repeated imaging attempts.  Antegrade flow in the posterior circulation with fairly codominant distal vertebral arteries.  No definite hemodynamically significant distal vertebral artery stenosis.  Patent vertebrobasilar junction. Basilar artery irregularity compatible with  atherosclerosis, but no  basilar stenosis.  AICA origins are patent.  SCA and PCA origins are within normal limits.  Small posterior communicating arteries are identified.  Bilateral PCA branches are within normal limits.  Antegrade flow in both ICA siphons.  No ICA stenosis.  Ophthalmic and posterior communicating artery origins are within normal limits.  Normal carotid termini, MCA and ACA origins.  Diminutive or absent anterior communicating artery.  Left MCA branches are within normal limits.  Left ACA branches are within normal limits.  Diminutive appearance of the right ACA A2 segment, which appears occluded or functionally occluded at the level of the pericallosal artery.  Right MCA origin is patent.  Right M1 segment is mildly irregular. Right MCA bifurcation is patent.  Anterior right MCA branches appear within normal limits.  Posterior right MCA branches are diminutive and show decreased flow compared to the left, but no proximal or major M2 branch occlusion is identified.  IMPRESSION: 1.  Posterior division right MCA branches appear diminutive with decreased flow signal compared to those on the left, but no proximal or major right MCA branch occlusion is identified. 2.  Attenuation of the right ACA A2 segment which  appears to occlude at the level of the pericallosal artery. 3.  Anterior circulation elsewhere is within normal limits. 4.  Posterior circulation atherosclerosis without high-grade stenosis or major branch occlusion.   Original Report Authenticated By: Erskine Speed, M.D.   Dg Chest Port 1 View  08/27/2012   *RADIOLOGY REPORT*  Clinical Data: Follow up pneumonia  PORTABLE CHEST - 1 VIEW  Comparison: 08/26/2012  Findings: Endotracheal tube terminates 3.5 cm above the carina.  Enteric tube courses below the diaphragm.  Low lung volumes with vascular crowding and chronic interstitial markings.  No frank interstitial edema.  Mild bibasilar opacities, likely atelectasis.  No pleural effusion or pneumothorax.  Mild  cardiomegaly.  Left rib fracture deformities.  IMPRESSION: Endotracheal tube terminates 2.5 cm above the carina.  Low lung volumes with mild lower lobe atelectasis.  No frank interstitial edema.   Original Report Authenticated By: Charline Bills, M.D.   Dg Chest Port 1 View  08/26/2012   *RADIOLOGY REPORT*  Clinical Data: Possible aspiration pneumonia, follow-up  PORTABLE CHEST - 1 VIEW  Comparison: Portable chest x-ray of 08/16/2012  Findings: No active infiltrate or effusion is seen.  Heart size is stable.  No bony abnormality is seen.  The endotracheal tube is unchanged in position.  IMPRESSION: No interval change.  The lungs remain clear.  Endotracheal tube is unchanged in position.   Original Report Authenticated By: Dwyane Dee, M.D.   Portable Chest Xray  08/26/2012   *RADIOLOGY REPORT*  Clinical Data: Cerebral infarction and status post intubation.  PORTABLE CHEST - 1 VIEW  Comparison: 07/07/2012  Findings: Endotracheal tube present with the tip approximately 3.5 cm above the carina.  Lungs show chronic elevation of the right hemidiaphragm with volume loss.  No overt edema, focal consolidation or pleural effusion is identified.  Heart size is stable and at the upper limits of normal.  IMPRESSION: Appropriate positioning of endotracheal tube.  No acute pulmonary abnormalities.   Original Report Authenticated By: Irish Lack, M.D.   Dg Abd Portable 1v  08/26/2012   *RADIOLOGY REPORT*  Clinical Data: Orogastric tube placement.  PORTABLE ABDOMEN - 1 VIEW  Comparison: Earlier today.  Findings: Orogastric or nasogastric tube extending through the stomach with its tip in the duodenal bulb and side hole in the distal gastric antrum.  Normal bowel gas pattern.  Lumbar spine degenerative changes.  IMPRESSION: Orogastric tube tip in the duodenal bulb and side hole in the distal gastric antrum.   Original Report Authenticated By: Beckie Salts, M.D.   Dg Abd Portable 1v  08/26/2012   *RADIOLOGY REPORT*   Clinical Data: Evaluate OG tube  PORTABLE ABDOMEN - 1 VIEW  Comparison: None.  Findings:  An enteric tube tip and side port project over the expected location of the gastric antrum.  Large colonic stool burden without definite evidence of obstruction.  No supine evidence of pneumoperitoneum.  No definite pneumatosis or portal venous gas.  Limited visualization of the lower thorax suggests mild elevation of the right hemidiaphragm.  Lumbar spine degenerative change.  IMPRESSION: 1.  Enteric tube tip and side port project over the expected location of the gastric antrum. 2.  Large colonic stool burden without evidence of obstruction.   Original Report Authenticated By: Tacey Ruiz, MD   Mr Mra Head/brain Wo Cm  08/26/2012   *RADIOLOGY REPORT*  Clinical Data:  79 year old male status post 08/19/2012 intravenous t-PA, subsequent NIR evaluation.  Code stroke presenting with signs and symptoms of right MCA infarct.  Comparison: Cerebral angiogram 08/28/2012.  Head CT without contrast 08/20/2012.  MRI HEAD WITHOUT CONTRAST  Technique: Multiplanar, multiecho pulse sequences of the brain and surrounding structures were obtained according to standard protocol without intravenous contrast.  Findings: Restricted diffusion in the right hemisphere with dense right ACA and posterior right MCA involvement.  Sub total involvement of the right basal ganglia.  Some sparing of the right MCA anterior division.  Right PCA territory is spared.  Associated multi focal right hemisphere parenchymal hemorrhages (series 9 image 16, 12) scattered in the affected areas. The largest of these are up to 5 cm diameter.  There is also associated subarachnoid hemorrhage visible on both T2* and FLAIR imaging. There is a small volume of intraventricular hemorrhage layering in the left occipital horn.  Subsequent leftward midline shift of 8 mm.  Associated effacement of the right lateral ventricle.  No ventriculomegaly at this time. Basilar cisterns  are patent.  Small focus of restricted diffusion in the left cingulate gyrus on series 5 image 15.  Mild diffusion abnormality associated with the subarachnoid hemorrhage.  No other left hemisphere restricted diffusion.  No posterior fossa restricted diffusion.  Major intracranial vascular flow voids the are preserved.  MRA findings are below.  Chronic encephalomalacia anterior inferior left frontal lobe. Pituitary, cervicomedullary junction and visualized cervical spine are within normal limits.  Normal bone marrow signal.  Fluid in the pharynx.  An enteric type tube is looped in the nasopharynx.  Paranasal sinus mucosal thickening and fluid.  Mastoids are clear. Visualized orbit soft tissues are within normal limits.  Negative scalp soft tissues.  IMPRESSION: 1.  Large combined right MCA and ACA acute infarct, some sparing of the anterior right MCA territory. 2.  Malignant hemorrhagic transformation of the infarct. Parenchymal and subarachnoid hemorrhage present.  Small volume of intraventricular hemorrhage. 3.  Effacement of the right lateral ventricle and leftward midline shift of 8 mm.  Basilar cisterns patent at this time. 4.  Oro-enteric tube looped in the nasopharynx, recommend repositioning.  (Called to [phone] Nurse Monica at the time of this report) 5.  See MRA findings below.  Critical Value/emergent results were called by telephone at the time of interpretation on 08/26/2012 at 1645 hours to provider Annie Main, who verbally acknowledged these results.  MRA HEAD WITHOUT CONTRAST  Technique: Angiographic images of the Circle of Willis were obtained using MRA technique without  intravenous contrast.  Findings: Study is mildly 1645 hours degraded by motion artifact despite repeated imaging attempts.  Antegrade flow in the posterior circulation with fairly codominant distal vertebral arteries.  No definite hemodynamically significant distal vertebral artery stenosis.  Patent vertebrobasilar junction. Basilar  artery irregularity compatible with atherosclerosis, but no basilar stenosis.  AICA origins are patent.  SCA and PCA origins are within normal limits.  Small posterior communicating arteries are identified.  Bilateral PCA branches are within normal limits.  Antegrade flow in both ICA siphons.  No ICA stenosis.  Ophthalmic and posterior communicating artery origins are within normal limits.  Normal carotid termini, MCA and ACA origins.  Diminutive or absent anterior communicating artery.  Left MCA branches are within normal limits.  Left ACA branches are within normal limits.  Diminutive appearance of the right ACA A2 segment, which appears occluded or functionally occluded at the level of the pericallosal artery.  Right MCA origin is patent.  Right M1 segment is mildly irregular. Right MCA bifurcation is patent.  Anterior right MCA branches appear within normal limits.  Posterior right MCA branches are diminutive and show decreased flow compared to the left, but no proximal or major M2 branch occlusion is identified.  IMPRESSION: 1.  Posterior division right MCA branches appear diminutive with decreased flow signal compared to those on the left, but no proximal or major right MCA branch occlusion is identified. 2.  Attenuation of the right ACA A2 segment which  appears to occlude at the level of the pericallosal artery. 3.  Anterior circulation elsewhere is within normal limits. 4.  Posterior circulation atherosclerosis without high-grade stenosis or major branch occlusion.   Original Report Authenticated By: Erskine Speed, M.D.   Lipid Panel     Component Value Date/Time   CHOL 82 08/26/2012 0518   TRIG 61 08/26/2012 0518   HDL 35* 08/26/2012 0518   CHOLHDL 2.3 08/26/2012 0518   VLDL 12 08/26/2012 0518   LDLCALC 35 08/26/2012 0518      ASSESSMENT / PLAN:  NEUROLOGIC A:  Acute ischemic stroke (R MCA/ACA) s/p tPA >> IR clot removal attempted, but unsuccessful due to location of clot >> complicated by  progression of CVA with ICH and SAH on MRI 6/12. P:   -further imaging per neurology -goal RASS -1 -family deciding on goals of care >> defer decision for 3% NS to neurology  PULMONARY A: Post procedure respiratory failure and possible aspiration pneumonia. P:   -full vent support >> family to decide whether tracheostomy would be in his best interest -daily wua/sbt -f/u CXR  CARDIOVASCULAR A: Hx of hypertension, hyperlipidemia, TAA. P:  -prn labetalol -continue lipitor for now -hold lotrel for now  RENAL A:  No acute issues P:   -monitor renal fx, urine outpt, electrolytes  GASTROINTESTINAL A:  Nutrition. P:   -PPI for SUP -continue tube feeds -family to decide if they would want PEG  HEMATOLOGIC A: No issues. P:  -f/u CBC -SCD for DVT prevention  INFECTIOUS A:  Fever with possible aspiration vs central in setting of CVA with ICH/SAH. Lower extremity venous stasis ulcers. P:   -D2/x unasyn >> d/c Abx if cultures negative -f/u sputum, blood cultures from 6/12 -wound care   ENDOCRINE A:  DM type II with hyperglycemia. P:   -SSI -increase lantus to 15 units -hold metformin for now  GOALS OF CARE >> No CPR/defibrillation, but  continue full medical care.  Prognosis for meaningful neuro recovery seems poor >> family deciding about goals of care.  CC time 40 minutes.  Coralyn Helling, MD [facility] Pulmonary/Critical Care 08/27/2012, 9:18 AM Pager:  [phone] After 3pm call: [phone]

## 2012-08-27 NOTE — Progress Notes (Signed)
Patient with 11 beat run of Vtach at 1312, BP 122/47 and HR returned to sinus rhythm at a rate of 60. Dr. Craige Cotta notified, no new orders received

## 2012-08-27 NOTE — Progress Notes (Signed)
Progress Note Follow up CT reviewed and compared to MRI and CT from admission.  There is significant worsening in the film since admission but other than some increased hemorrhage, particularly noted in the left hemisphere, there does not appear to be significant change otherwise.  Midline shift and lateral ventricle compression are reasonably stable.   Discussion had with nurse and NIHSS scores discussed from 8pm to now and have remained stable without change.  Nurse advised to continue with full NIHSS instead of modified from this point forward and to notify if changes.    Thana Farr, MD [facility] [phone] 08/27/2012,  3:29AM

## 2012-08-27 NOTE — Progress Notes (Signed)
OT Cancellation Note  Patient Details Name: Garrett [name] MRN: [id number] DOB: 08-14-1933   Cancelled Treatment:    Reason Eval/Treat Not Completed: Patient not medically ready;Other (comment) (strict bedrest) Will re-attempt pending updated activity orders.   08/27/2012 Cipriano Mile OTR/L Pager [phone] Office [phone]

## 2012-08-27 NOTE — Progress Notes (Addendum)
Speech Language Pathology Discharge Patient Details Name: AKILI CORSETTI MRN: [id number] DOB: 26-Jun-1933 Today's Date: 08/27/2012 Time:  -     Patient discharged from SLP services secondary to:  Unable to wean from went, discussing trach??  Please see latest therapy progress note for current level of functioning and progress toward goals.    Progress and discharge plan discussed with patient and/or caregiver: not disucssed with family/pt.  Please reconsult if he is able to come off vent or is trach'd and appropriate for speaking valve  GO    Royce Macadamia M.Ed [employer] [phone]   08/27/2012, 10:21 AM

## 2012-08-27 NOTE — Progress Notes (Signed)
eLink Physician-Brief Progress Note Patient Name: Garrett [name] DOB: October 29, 1933 MRN: [id number]  Date of Service  08/27/2012   HPI/Events of Note   ?? ST elevatin on monitor  eICU Interventions  12 lead ekg   Intervention Category Minor Interventions: Other:  Rainer Mounce 08/27/2012, 6:53 PM

## 2012-08-27 NOTE — Progress Notes (Signed)
NUTRITION FOLLOW UP  Intervention:   Continue Jevity 1.2 @ 30 ml/hr with 60 ml Prostat TID. Continue MVI.   TF regimen and current Propofol will provide 1857 total calories (96% of needs)  Nutrition Dx:   Inadequate oral intake related to inability to eat as evidenced by NPO status; ongoing.   Goal:   Pt to meet >/= 90% of their estimated nutrition needs; met.   Monitor:   Vent status, TF tolerance, weight trend, labs  Assessment:   Per RN pt with recent hx of hip fracture and was at [facility] for rehab. Pt is s/p iv t-PA, pt tx to [facility] but R MCA clot was not amenable to intervention.  Pt discussed during ICU rounds and with RN. Per RN pt likely to remain on Propofol sedation over weekend, family meeting scheduled for Monday.   Patient is currently intubated on ventilator support.  MV: 11.8 Temp:Temp (24hrs), Avg:100.3 F (37.9 C), Min:99.2 F (37.3 C), Max:101.9 F (38.8 C)  Propofol: 14.9 ml/hr providing 393 kcal per day from lipid   Patient has OG tube in place with tip of tube in the distal gastric antrum. Jevity 1.2 is infusing @ 30 ml/hr. 60 ml Prostat via tube TID. Tube feeding regimen currently providing 1464 kcal, 129 grams protein, and 583 ml H2O.   Free water flushes: NA  Residuals: 0  Height: Ht Readings from Last 1 Encounters:  08/24/2012 5\' 6"  (1.676 m)    Weight Status:   Wt Readings from Last 1 Encounters:  08/19/2012 182 lb 8.7 oz (82.8 kg)    Re-estimated needs:  Kcal: 1928 Protein: 120-140 grams Fluid: > 2.4 L/day  Skin: leg  Diet Order:   NPO   Intake/Output Summary (Last 24 hours) at 08/27/12 1352 Last data filed at 08/27/12 1205  Gross per 24 hour  Intake 2686.59 ml  Output   1100 ml  Net 1586.59 ml    Last BM: PTA   Labs:   Recent Labs Lab 08/21/2012 1905  08/16/2012 1926 08/26/12 0518 08/27/12 0600  NA 140  < > 140 139 139  K 3.9  < > 4.2 3.7 3.4*  CL 102  < > 104 104 106  CO2 29  --   --  25 26  BUN 15  < > 16 15 22   CREATININE  0.76  < > 0.90 0.82 0.73  CALCIUM 9.5  --   --  8.3* 8.3*  GLUCOSE 226*  < > 225* 181* 193*  < > = values in this interval not displayed.  CBG (last 3)   Recent Labs  08/27/12 0410 08/27/12 0756 08/27/12 1211  GLUCAP 162* 203* 221*    Scheduled Meds: . ampicillin-sulbactam (UNASYN) IV  3 g Intravenous Q8H  . antiseptic oral rinse  15 mL Mouth Rinse QID  . atorvastatin  40 mg Per Tube q1800  . chlorhexidine  15 mL Mouth Rinse BID  . Chlorhexidine Gluconate Cloth  6 each Topical Q0600  . feeding supplement (JEVITY 1.2 CAL)  1,000 mL Per Tube Q24H  . feeding supplement  60 mL Per Tube TID  . insulin aspart  0-15 Units Subcutaneous Q4H  . insulin glargine  15 Units Subcutaneous QHS  . multivitamin with minerals  1 tablet Per Tube Daily  . mupirocin ointment  1 application Nasal BID  . pantoprazole sodium  40 mg Per Tube Q24H    Continuous Infusions: . sodium chloride 30 mL/hr at 08/27/12 1200  . propofol 30 mcg/kg/min (  08/27/12 1205)    Kendell Bane RD, LDN, CNSC [phone] Pager [phone] After Hours Pager

## 2012-08-27 NOTE — Progress Notes (Signed)
PT Cancellation Note  Patient Details Name: Garrett [name] MRN: [id number] DOB: April 02, 1933   Cancelled Treatment:    Reason Eval/Treat Not Completed: Medical issues which prohibited therapy.  Patient remains on vent and strict bedrest.  Will return at later date for PT evaluation when appropriate.   Vena Austria 08/27/2012, 11:55 AM Durenda Hurt. Renaldo Fiddler, [facility] Acute Rehab Services Pager [phone]

## 2012-08-28 ENCOUNTER — Inpatient Hospital Stay (HOSPITAL_COMMUNITY): Payer: Medicare Other

## 2012-08-28 LAB — BASIC METABOLIC PANEL
CO2: 25 mEq/L (ref 19–32)
Chloride: 107 mEq/L (ref 96–112)
Glucose, Bld: 256 mg/dL — ABNORMAL HIGH (ref 70–99)
Sodium: 140 mEq/L (ref 135–145)

## 2012-08-28 LAB — TRIGLYCERIDES: Triglycerides: 96 mg/dL (ref <150)

## 2012-08-28 LAB — CBC
Hemoglobin: 11.7 g/dL — ABNORMAL LOW (ref 13.0–17.0)
MCV: 88.2 fL (ref 78.0–100.0)
Platelets: 179 10*3/uL (ref 150–400)
RBC: 3.9 MIL/uL — ABNORMAL LOW (ref 4.22–5.81)
WBC: 12.1 10*3/uL — ABNORMAL HIGH (ref 4.0–10.5)

## 2012-08-28 LAB — GLUCOSE, CAPILLARY
Glucose-Capillary: 248 mg/dL — ABNORMAL HIGH (ref 70–99)
Glucose-Capillary: 255 mg/dL — ABNORMAL HIGH (ref 70–99)

## 2012-08-28 LAB — CULTURE, RESPIRATORY W GRAM STAIN

## 2012-08-28 MED ORDER — JEVITY 1.2 CAL PO LIQD
1000.0000 mL | ORAL | Status: DC
  Administered 2012-08-29 – 2012-08-30 (×2): 1000 mL
  Filled 2012-08-28 (×4): qty 1000

## 2012-08-28 MED ORDER — POTASSIUM CHLORIDE 20 MEQ/15ML (10%) PO LIQD
ORAL | Status: AC
  Filled 2012-08-28: qty 30

## 2012-08-28 MED ORDER — POTASSIUM CHLORIDE 20 MEQ/15ML (10%) PO LIQD
40.0000 meq | Freq: Once | ORAL | Status: AC
  Administered 2012-08-28: 40 meq
  Filled 2012-08-28: qty 30

## 2012-08-28 MED ORDER — INSULIN GLARGINE 100 UNIT/ML ~~LOC~~ SOLN
20.0000 [IU] | Freq: Every day | SUBCUTANEOUS | Status: DC
  Administered 2012-08-28: 20 [IU] via SUBCUTANEOUS
  Filled 2012-08-28 (×2): qty 0.2

## 2012-08-28 NOTE — Progress Notes (Signed)
Stroke Team Progress Note  HISTORY Garrett [name] is a 79 y.o. male resident of [facility] who was last seen normal at 4 PM on 16-Sep-2012 and then was found around 5pm and presented to [facility] with left sided weakness. Telestroke was activated, but after a delay, the patient was discussed with Dr. Amada Jupiter over the phone and the decision was made to give tPA within the 4.5 hour window.   He was transferred to [facility] for further evaluation and possible intervention. He has previously stated that in the event of a cardiac arrest, he would not want resuscitation but has specifying that he would want intubation for procedures. After discussing with his daughter, the decision was made to go ahead with intervention and he was taken to IR.  He was in a facility undergoing rehab for hip surgery(performed 2 months ago)   SUBJECTIVE The patient is intubated. No family is in the room.  OBJECTIVE Most recent Vital Signs: Filed Vitals:   08/28/12 0500 08/28/12 0600 08/28/12 0724 08/28/12 0800  BP: 152/51 149/46  161/56  Pulse: 63 67  66  Temp:   100.6 F (38.1 C)   TempSrc:   Oral   Resp: 18 22  20   Height:      Weight:  186 lb 1.1 oz (84.4 kg)    SpO2: 99% 100%  99%   CBG (last 3)   Recent Labs  08/27/12 2359 08/28/12 0441 08/28/12 0720  GLUCAP 248* 255* 198*    IV Fluid Intake:   . sodium chloride 30 mL/hr at 08/28/12 0800  . propofol Stopped (08/28/12 0800)    MEDICATIONS  . ampicillin-sulbactam (UNASYN) IV  3 g Intravenous Q8H  . antiseptic oral rinse  15 mL Mouth Rinse QID  . atorvastatin  40 mg Per Tube q1800  . chlorhexidine  15 mL Mouth Rinse BID  . Chlorhexidine Gluconate Cloth  6 each Topical Q0600  . feeding supplement (JEVITY 1.2 CAL)  1,000 mL Per Tube Q24H  . feeding supplement  60 mL Per Tube TID  . insulin aspart  0-15 Units Subcutaneous Q4H  . insulin glargine  15 Units Subcutaneous QHS  . multivitamin with minerals  1 tablet  Per Tube Daily  . mupirocin ointment  1 application Nasal BID  . pantoprazole sodium  40 mg Per Tube Q24H   PRN:  acetaminophen, acetaminophen, fentaNYL, labetalol  Diet:    NPO Activity:  Bedrest DVT Prophylaxis:  SCDs ordered  CLINICALLY SIGNIFICANT STUDIES Basic Metabolic Panel:   Recent Labs Lab 08/27/12 0600 08/28/12 0530  NA 139 140  K 3.4* 3.3*  CL 106 107  CO2 26 25  GLUCOSE 193* 256*  BUN 22 22  CREATININE 0.73 0.69  CALCIUM 8.3* 8.2*   Liver Function Tests:   Recent Labs Lab 16-Sep-2012 1905  AST 11  ALT 13  ALKPHOS 106  BILITOT 0.2*  PROT 6.7  ALBUMIN 3.4*   CBC:  Recent Labs Lab 16-Sep-2012 1905  08/26/12 0518 08/27/12 0600 08/28/12 0530  WBC 10.1  --  11.8* 12.2* 12.1*  NEUTROABS 7.9*  --  10.1*  --   --   HGB 14.2  < > 12.0* 11.5* 11.7*  HCT 41.3  < > 34.9* 33.9* 34.4*  MCV 87.9  --  86.8 88.3 88.2  PLT 268  --  233 174 179  < > = values in this interval not displayed. Coagulation:   Recent Labs Lab September 16, 2012 1905  LABPROT 12.5  INR 0.94   Cardiac Enzymes:   Recent Labs Lab 09/09/2012 1905  TROPONINI <0.30   Urinalysis:   Recent Labs Lab 09/11/2012 1918  COLORURINE YELLOW  LABSPEC 1.015  PHURINE 6.0  GLUCOSEU >1000*  HGBUR NEGATIVE  BILIRUBINUR NEGATIVE  KETONESUR NEGATIVE  PROTEINUR NEGATIVE  UROBILINOGEN 0.2  NITRITE NEGATIVE  LEUKOCYTESUR NEGATIVE   Lipid Panel    Component Value Date/Time   CHOL 82 08/26/2012 0518   TRIG 96 08/28/2012 0530   HDL 35* 08/26/2012 0518   CHOLHDL 2.3 08/26/2012 0518   VLDL 12 08/26/2012 0518   LDLCALC 35 08/26/2012 0518   HgbA1C  Lab Results  Component Value Date   HGBA1C 8.5* 08/26/2012    Urine Drug Screen:     Component Value Date/Time   LABOPIA NONE DETECTED 08/31/2012 1918   COCAINSCRNUR NONE DETECTED 09/11/2012 1918   LABBENZ NONE DETECTED 08/22/2012 1918   AMPHETMU NONE DETECTED 08/24/2012 1918   THCU NONE DETECTED 08/18/2012 1918   LABBARB NONE DETECTED 09/08/2012 1918    Alcohol  Level:   Recent Labs Lab 08/24/2012 1905  ETH <11   CT of the brain   09/06/2012   Motion degraded exam.  Subtle hypodensity posterior right opercular region.  Acute infarct not excluded.  Motion limits evaluation.  Bifrontal encephalomalacia result of prior trauma.  Mild polypoid opacification in the left maxillary sinus.  Mild mucosal thickening left ethmoid sinus air cells.  08/27/2012 Significant interval progression of changes since previous study with large infarction not demonstrated in the right cerebral hemisphere. Interval development of increased edema and mass effect with 8 mm right-to-left midline shift. Interval development of intraparenchymal and subarachnoid hemorrhage in the right cerebral hemisphere and focally in the left convexity. Intraventricular hemorrhage in the left lateral ventricle  Cerebral angiogram S/P bilateral common carotid arteriograms and lt vertebral arteriogram. Rt CFA approach.  Findings:  1.Occluded RT MCA inf divison angular branch and RT ACA A2 seg . 2.Large area of hypoperfusion in the Rt parietal cortical/subcortical region    MRI of the brain   Large combined right MCA and ACA acute infarct, some sparing of the anterior right MCA territory.  Malignant hemorrhagic transformation of the infarct. Parenchymal and subarachnoid hemorrhage present. Small volume of  intraventricular hemorrhage.Effacement of the right lateral ventricle and leftward midline shift of 8 mm. Basilar cisterns patent at this time  MRA of the brain  Posterior division right MCA branches appear diminutive with decreased flow signal compared to those on the left, but no proximal or major right MCA branch occlusion is identified. Attenuation of the right ACA A2 segment which appears to occlude at the level of the pericallosal artery. Anterior circulation elsewhere is within normal limits. Posterior circulation atherosclerosis without high-grade stenosis or major branch occlusion.  2D  Echocardiogram  EF 50% with diffuse HK  Carotid Doppler  Preliminary report: Bilateral: Less than 39% ICA stenosis. Vertebral artery flow is antegrade.   LE  Venous Dopplers: Bilateral: No evidence of DVT, superficial thrombosis, or Baker's Cyst.  CXR   08/26/2012    No interval change.  The lungs remain clear.  Endotracheal tube is unchanged in position.    08/26/2012    Appropriate positioning of endotracheal tube.  No acute pulmonary abnormalities.   EKG  normal sinus rhythm.   Therapy Recommendations PENDING  Physical Exam  General: The patient is intubated, sedated at the time of the examination.  Respiratory: Lung fields appear to be clear.  Abdomen:  Abdomen is soft, nontender, positive bowel sounds.  Skin: No significant peripheral edema is noted.   Neurologic Exam  Cranial nerves:  The patient is intubated, sedated. The patient has eye deviation to the right. The patient is not following commands.  Motor: The patient is not following commands for motor testing. With sternal rub, the patient is decerebrate on the left arm.  Coordination: The patient will not cooperate for cerebellar testing.  Gait and station: The patient could not be ambulated, the patient is bedridden.  Reflexes: Deep tendon reflexes are symmetric, depressed. The patient has a Babinski on the left.    ASSESSMENT Mr. Garrett [name] is a 79 y.o. male presenting with left hemiparesis at [facility]. Status post IV t-PA 08/26/2012 at 2015 at [facility], transferred to [facility] where cerebral angio was performed which showed a distal R MCA inferior division brain clot not amenable to intervention. Patient with right brain cortical and subcortical infarct.  Infarct felt to be embolic secondary to unknown source.  On no antithrombotics prior to admission, patient received tPA. After 12 hours post tPA patient now on no antithrombotics due to transformation hemorrhage, subarachnoid hemorrhage within 24h of tpa  administration for secondary stroke prevention. Patient with resultant VDRF, left hemiparesis, left facial droop, dysphagia, lethargy. Work up underway.   Post neuro intervention respiratory failure  Febrile,  99.5;  Presumed aspiration pneumonia, on unasyn Hypertension Hyperlipidemia, LDL 35, on crestor 20 PTA, now on lipitor in the hospital, goal LDL < 100 (< 70 for diabetics) Diabetes, HgbA1c 10.0 in April 2014, goal < 7.0 Hx SDH following fall March 2012 Right hip fracture 2 mos ago, no surgical intervention, currently in [facility] undergoing rehab, on 08/17/2012 was cleared for 50% NWB x 2 wks then progress. 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   Hospital day # 3  TREATMENT/PLAN  No antitthrombotics given hemorrhagic transformations  CCM manage vent, extubation  Goal to normalize temperature for optimal stroke outcome  No CPR/defibrillation, but continue full medical care  Risk factor modification   Prognosis poor and likely need trach/peg/SNF and son to decide soon about this. The family wishes to have a meeting on Monday to help decide these issues.  WILLIS,CHARLES KEITH  08/28/2012 8:12 AM

## 2012-08-28 NOTE — Progress Notes (Signed)
PULMONARY  / CRITICAL CARE MEDICINE  Name: Garrett [name] MRN: [id number] DOB: [birth year]    ADMISSION DATE:  08/15/2012 CONSULTATION DATE:  08/20/2012  REFERRING MD :  Amada Jupiter PRIMARY SERVICE: Neurology  CHIEF COMPLAINT:  Stroke, Vent management  BRIEF PATIENT DESCRIPTION:  79 yo male former smoker presented to [facility] with altered mental status with Lt facial droop and Lt sided weakness >> given tPA w/o improvement.  Transferred to [facility] for tx with neuro IR >> intubated for procedure, and PCCM consulted to assist with vent/medical management.   SIGNIFICANT EVENTS:  6/11 [facility] ED for stroke symptoms > tpa, neuro IR procedure 6/12 Fever, failed SBT  STUDIES: 6/11 cerebral angiogram> occluded Rt MCA inf division branch and R ACA A2 seg; large area of hypoperfusion in the R parietal cortical/subcortical region  6/12 Echo >> EF 50%, diffuse hypokinesis, grade 1 diastolic dysfx 6/12 MRI brain >> Large ACA/MCA infarct with hemorrhagic transformation and SAH, 8 mm midline shift 6/12 CT head >> increased edema and mass effect with 8 mm Rt to Lt shift, SAH  LINES / TUBES: 6/11 ETT >> 6/11 R fem arterial sheath >> 6/12  CULTURES: Sputum 6/12 >>  Blood 6/12 >>   ANTIBIOTICS: Unasyn 6/12 >>   SUBJECTIVE:  Tm 101.9.  Decreased secretions.  VITAL SIGNS: Temp:  [99.5 F (37.5 C)-101.8 F (38.8 C)] 100.6 F (38.1 C) (06/14 0724) Pulse Rate:  [57-77] 72 (06/14 1000) Resp:  [16-30] 30 (06/14 1000) BP: (111-165)/(45-90) 165/50 mmHg (06/14 1000) SpO2:  [97 %-100 %] 98 % (06/14 1000) FiO2 (%):  [30 %-40 %] 30 % (06/14 1000) Weight:  [84.4 kg (186 lb 1.1 oz)] 84.4 kg (186 lb 1.1 oz) (06/14 0600) VENTILATOR SETTINGS: Vent Mode:  [-] CPAP;PSV FiO2 (%):  [30 %-40 %] 30 % Set Rate:  [16 bmp] 16 bmp Vt Set:  [560 mL] 560 mL PEEP:  [5 cmH20] 5 cmH20 Pressure Support:  [10 cmH20] 10 cmH20 Plateau Pressure:  [15 cmH20-17 cmH20] 16 cmH20 INTAKE / OUTPUT: Intake/Output     06/13 0701 - 06/14  0700 06/14 0701 - 06/15 0700   I.V. (mL/kg) 1205.2 (14.3) 108.9 (1.3)   NG/GT 810 100   IV Piggyback 300 100   Total Intake(mL/kg) 2315.2 (27.4) 308.9 (3.7)   Urine (mL/kg/hr) 1650 (0.8) 500 (1.7)   Total Output 1650 500   Net +665.2 -191.1          PHYSICAL EXAMINATION:  Gen: Sedated, no purposeful movement  HEENT: OG, ETT in place PULM: scattered rhonchi CV: regular, no murmur AB: BS+, soft, nontender Ext: no edema Derm: chronic venous stasis changes Neuro: RASS -2   LABS:  Recent Labs Lab 08/26/12 0518 08/27/12 0600 08/28/12 0530  NA 139 139 140  K 3.7 3.4* 3.3*  CL 104 106 107  CO2 25 26 25   BUN 15 22 22   CREATININE 0.82 0.73 0.69  GLUCOSE 181* 193* 256*    Recent Labs Lab 08/26/12 0518 08/27/12 0600 08/28/12 0530  HGB 12.0* 11.5* 11.7*  HCT 34.9* 33.9* 34.4*  WBC 11.8* 12.2* 12.1*  PLT 233 174 179     Recent Labs Lab 08/27/12 1550 08/27/12 2001 08/27/12 2359 08/28/12 0441 08/28/12 0720  GLUCAP 213* 240* 248* 255* 198*    Imaging: PCXR: left base ATX.No sig change in overall aeration  Lipid Panel     Component Value Date/Time   CHOL 82 08/26/2012 0518   TRIG 96 08/28/2012 0530   HDL 35* 08/26/2012 0518  CHOLHDL 2.3 08/26/2012 0518   VLDL 12 08/26/2012 0518   LDLCALC 35 08/26/2012 0518      ASSESSMENT / PLAN:  NEUROLOGIC A:   Acute ischemic stroke (R MCA/ACA) s/p tPA >> IR clot removal attempted, but unsuccessful due to location of clot >> complicated by progression of CVA with ICH and SAH on MRI 6/12. Less purposeful today.  P:   -further imaging per neurology -goal RASS -1 -family deciding on goals of care  PULMONARY A:  Post procedure respiratory failure and possible aspiration pneumonia. Tolerates PSV for short perioda but MS will not allow for airway protection  P:   -full vent support >> family to decide whether tracheostomy would be in his best interest -daily wua/sbt -f/u CXR  CARDIOVASCULAR A: Hx of hypertension,  hyperlipidemia, TAA. P:  -prn labetalol -continue lipitor for now -hold lotrel for now  RENAL A:  Hypokalemia  P:   -got KCL will recheck in AM   GASTROINTESTINAL A:  Nutrition. P:   -PPI for SUP -continue tube feeds -family to decide if they would want PEG Monday   HEMATOLOGIC A: No issues. P:  -f/u CBC -SCD for DVT prevention  INFECTIOUS A:  Fever with possible aspiration vs central in setting of CVA with ICH/SAH. Lower extremity venous stasis ulcers. P:   -D3/x unasyn >> d/c Abx if cultures negative -f/u sputum, blood cultures from 6/12 -wound care   ENDOCRINE A:  DM type II with hyperglycemia. P:   -SSI -increase lantus to 20 units -hold metformin for now  GOALS OF CARE >> No CPR/defibrillation, but continue full medical care.  Prognosis for meaningful neuro recovery seems poor >> family deciding about goals of care.   Note by Anders Simmonds nurse practitioner  STAFF NOTE: I, Dr Lavinia Sharps have personally reviewed patient's available data, including medical history, events of note, physical examination and test results as part of my evaluation. I have discussed with resident/NP and other care providers such as pharmacist, RN and RRT.  In addition,  I personally evaluated patient and elicited key findings of acute respiratory failure secondary to stroke. Currently having fevers. Prognosis is very poor.  Rest per NP/medical resident whose note is outlined above and that I agree with  The patient is critically ill with multiple organ systems failure and requires high complexity decision making for assessment and support, frequent evaluation and titration of therapies, application of advanced monitoring technologies and extensive interpretation of multiple databases.   Critical Care Time devoted to patient care services described in this note is  35  Minutes independent of nurse practitioner time.  Dr. Kalman Shan, M.D., [facility].C.P Pulmonary and Critical Care  Medicine Staff Physician [facility]monary [facility] Critical [facility] Pager: [phone], If no answer or between  15:00h - 7:00h: call [phone]  08/28/2012 11:44 AM

## 2012-08-28 NOTE — Progress Notes (Signed)
eLink Physician-Brief Progress Note Patient Name: Garrett [name] DOB: December 25, 1933 MRN: [id number]  Date of Service  08/28/2012   HPI/Events of Note  Hypokalemia   eICU Interventions  Potassium replaced   Intervention Category Minor Interventions: Electrolytes abnormality - evaluation and management  [name] 08/28/2012, 6:52 AM

## 2012-08-29 LAB — BASIC METABOLIC PANEL
BUN: 29 mg/dL — ABNORMAL HIGH (ref 6–23)
Calcium: 8.7 mg/dL (ref 8.4–10.5)
GFR calc non Af Amer: >90 mL/min (ref >90)
Glucose, Bld: 233 mg/dL — ABNORMAL HIGH (ref 70–99)

## 2012-08-29 LAB — CBC
HCT: 35.9 % — ABNORMAL LOW (ref 39.0–52.0)
Hemoglobin: 12.2 g/dL — ABNORMAL LOW (ref 13.0–17.0)
MCH: 29.8 pg (ref 26.0–34.0)
MCHC: 34 g/dL (ref 30.0–36.0)

## 2012-08-29 LAB — GLUCOSE, CAPILLARY: Glucose-Capillary: 232 mg/dL — ABNORMAL HIGH (ref 70–99)

## 2012-08-29 MED ORDER — POTASSIUM CHLORIDE 20 MEQ/15ML (10%) PO LIQD
40.0000 meq | Freq: Once | ORAL | Status: AC
  Administered 2012-08-29: 40 meq
  Filled 2012-08-29: qty 30

## 2012-08-29 MED ORDER — POTASSIUM CHLORIDE 20 MEQ/15ML (10%) PO LIQD
40.0000 meq | Freq: Every day | ORAL | Status: DC
  Filled 2012-08-29: qty 30

## 2012-08-29 MED ORDER — INSULIN GLARGINE 100 UNIT/ML ~~LOC~~ SOLN
40.0000 [IU] | Freq: Every day | SUBCUTANEOUS | Status: DC
  Administered 2012-08-29: 40 [IU] via SUBCUTANEOUS
  Filled 2012-08-29 (×2): qty 0.4

## 2012-08-29 NOTE — Progress Notes (Addendum)
PULMONARY  / CRITICAL CARE MEDICINE  Name: Garrett [name] MRN: [id number] DOB: 13-Mar-1934    ADMISSION DATE:  08/23/2012 CONSULTATION DATE:  01-Sep-2012  REFERRING MD :  Amada Jupiter PRIMARY SERVICE: Neurology  CHIEF COMPLAINT:  Stroke, Vent management  BRIEF PATIENT DESCRIPTION:  79 yo male former smoker presented to [facility] with altered mental status with Lt facial droop and Lt sided weakness >> given tPA w/o improvement.  Transferred to [facility] for tx with neuro IR >> intubated for procedure, and PCCM consulted to assist with vent/medical management.   SIGNIFICANT EVENTS:  6/11 [facility] ED for stroke symptoms > tpa, neuro IR procedure 6/12 Fever, failed SBT  STUDIES: 6/11 cerebral angiogram> occluded Rt MCA inf division branch and R ACA A2 seg; large area of hypoperfusion in the R parietal cortical/subcortical region  6/12 Echo >> EF 50%, diffuse hypokinesis, grade 1 diastolic dysfx 6/12 MRI brain >> Large ACA/MCA infarct with hemorrhagic transformation and SAH, 8 mm midline shift 6/12 CT head >> increased edema and mass effect with 8 mm Rt to Lt shift, SAH  LINES / TUBES: 6/11 ETT >> 6/11 R fem arterial sheath >> 6/12  CULTURES: Sputum 6/12 >> no organisms Blood 6/12 >>   ANTIBIOTICS: Unasyn 6/12 >>   SUBJECTIVE:  Tm 101.9.  Decreased secretions.  VITAL SIGNS: Temp:  [98 F (36.7 C)-102.2 F (39 C)] 98 F (36.7 C) (06/15 0400) Pulse Rate:  [55-76] 69 (06/15 0900) Resp:  [13-30] 24 (06/15 0900) BP: (129-166)/(39-70) 166/59 mmHg (06/15 0900) SpO2:  [97 %-100 %] 97 % (06/15 0900) FiO2 (%):  [30 %] 30 % (06/15 0900) Weight:  [82.8 kg (182 lb 8.7 oz)] 82.8 kg (182 lb 8.7 oz) (06/15 0500) VENTILATOR SETTINGS: Vent Mode:  [-] CPAP;PSV FiO2 (%):  [30 %] 30 % Set Rate:  [16 bmp] 16 bmp Vt Set:  [560 mL] 560 mL PEEP:  [5 cmH20] 5 cmH20 Pressure Support:  [12 cmH20-14 cmH20] 12 cmH20 Plateau Pressure:  [16 cmH20-17 cmH20] 17 cmH20 INTAKE / OUTPUT: Intake/Output     06/14 0701 -  06/15 0700 06/15 0701 - 06/16 0700   I.V. (mL/kg) 894.4 (10.8) 75 (0.9)   NG/GT 670 110   IV Piggyback 300    Total Intake(mL/kg) 1864.4 (22.5) 185 (2.2)   Urine (mL/kg/hr) 2665 (1.3)    Total Output 2665     Net -800.6 +185        Stool Occurrence 1 x      PHYSICAL EXAMINATION:  Gen: Sedated, no purposeful movement  HEENT: OG, ETT in place PULM: scattered rhonchi CV: regular, no murmur AB: BS+, soft, nontender Ext: no edema Derm: chronic venous stasis changes Neuro: RASS -2   LABS:  Recent Labs Lab 08/27/12 0600 08/28/12 0530 08/29/12 0535  NA 139 140 144  K 3.4* 3.3* 3.3*  CL 106 107 111  CO2 26 25 27   BUN 22 22 29*  CREATININE 0.73 0.69 0.62  GLUCOSE 193* 256* 233*    Recent Labs Lab 08/27/12 0600 08/28/12 0530 08/29/12 0535  HGB 11.5* 11.7* 12.2*  HCT 33.9* 34.4* 35.9*  WBC 12.2* 12.1* 9.5  PLT 174 179 211     Recent Labs Lab 08/28/12 1313 08/28/12 1541 08/28/12 1928 08/29/12 0023 08/29/12 0405  GLUCAP 244* 261* 228* 307* 217*    Imaging: PCXR: left base ATX.No sig change in overall aeration  Lipid Panel     Component Value Date/Time   CHOL 82 08/26/2012 0518   TRIG 96 08/28/2012 0530  HDL 35* 08/26/2012 0518   CHOLHDL 2.3 08/26/2012 0518   VLDL 12 08/26/2012 0518   LDLCALC 35 08/26/2012 0518      ASSESSMENT / PLAN:  NEUROLOGIC A:   Acute ischemic stroke (R MCA/ACA) s/p tPA >> IR clot removal attempted, but unsuccessful due to location of clot >> complicated by progression of CVA with ICH and SAH on MRI 6/12. Less purposeful today.  P:   -goal RASS -1 -family deciding on goals of care - Planned family meeting with neurology 6/16  PULMONARY A:  Post procedure respiratory failure and possible aspiration pneumonia. Tolerates PSV for short perioda but MS will not allow for airway protection  P:   -full vent support -daily wua/sbt -f/u CXR  CARDIOVASCULAR A: Hx of hypertension, hyperlipidemia, TAA. P:  -prn  labetalol -continue lipitor for now -hold lotrel for now  RENAL A:  Hypokalemia  P:   -Replete electrolytes  GASTROINTESTINAL A:  Nutrition. P:   -PPI for SUP -continue tube feeds -family to decide if they would want PEG Monday   HEMATOLOGIC A: No issues. P:  -f/u CBC -SCD for DVT prevention  INFECTIOUS A:  Fever with possible aspiration vs central in setting of CVA with ICH/SAH. Lower extremity venous stasis ulcers. P:   -f/u blood cultures from 6/12 - D/C unasyn -wound care   ENDOCRINE A:  DM type II with hyperglycemia. P:   -SSI -increase lantus to 40 units    GOALS OF CARE >> No CPR/defibrillation, but continue full medical care.  Prognosis for meaningful neuro recovery seems poor >> family deciding about goals of care.    [facility] [place] S-ACNP and Anders Simmonds NP   08/29/2012 9:27 AM     STAFF NOTE: I, Dr Lavinia Sharps have personally reviewed patient's available data, including medical history, events of note, physical examination and test results as part of my evaluation. I have discussed with resident/NP and other care providers such as pharmacist, RN and RRT.  In addition,  I personally evaluated patient and elicited key findings ofoday's Summary: Pt remains unresponsive with poor neurologic examination. Will adjust insulin for hyperglycemia. Family plans to meet with neurology 6/16 to discuss goals of care. I updated  daughter and granddaughter.  Marland Kitchen  Rest per NP/medical resident whose note is outlined above and that I agree with     Dr. Kalman Shan, M.D., [facility].C.P Pulmonary and Critical Care Medicine Staff Physician [facility] [place] [facility]monary [facility] Critical [facility] Pager: [phone], If no answer or between  15:00h - 7:00h: call [phone]  08/29/2012 1:47 PM

## 2012-08-29 NOTE — Progress Notes (Signed)
eLink Physician-Brief Progress Note Patient Name: Garrett [name] DOB: [birth year] MRN: [id number]  Date of Service  08/29/2012   HPI/Events of Note  Hypokalemia   eICU Interventions  Potassium replaced   Intervention Category Minor Interventions: Electrolytes abnormality - evaluation and management  DETERDING,ELIZABETH 08/29/2012, 6:50 AM

## 2012-08-29 NOTE — Progress Notes (Signed)
Stroke Team Progress Note  HISTORY CLAUDIA ALVIZO is a 79 y.o. male resident of [facility] who was last seen normal at 4 PM on 08/23/2012 and then was found around 5pm and presented to [facility] [place] with left sided weakness. Telestroke was activated, but after a delay, the patient was discussed with Dr. Amada Jupiter over the phone and the decision was made to give tPA within the 4.5 hour window.   He was transferred to [facility] for further evaluation and possible intervention. He has previously stated that in the event of a cardiac arrest, he would not want resuscitation but has specifying that he would want intubation for procedures. After discussing with his daughter, the decision was made to go ahead with intervention and he was taken to IR.  He was in a facility undergoing rehab for hip surgery(performed 2 months ago)   SUBJECTIVE The patient is intubated. No family is in the room.  OBJECTIVE Most recent Vital Signs: Filed Vitals:   08/29/12 0400 08/29/12 0500 08/29/12 0600 08/29/12 0700  BP: 144/53 146/56  151/70  Pulse: 66 60 66 61  Temp: 98 F (36.7 C)     TempSrc: Oral     Resp: 19 17 21 17   Height:      Weight:  182 lb 8.7 oz (82.8 kg)    SpO2: 98% 98% 98% 100%   CBG (last 3)   Recent Labs  08/28/12 1928 08/29/12 0023 08/29/12 0405  GLUCAP 228* 307* 217*    IV Fluid Intake:   . sodium chloride 30 mL/hr at 08/28/12 1900  . propofol 15 mcg/kg/min (08/29/12 0700)    MEDICATIONS  . ampicillin-sulbactam (UNASYN) IV  3 g Intravenous Q8H  . antiseptic oral rinse  15 mL Mouth Rinse QID  . atorvastatin  40 mg Per Tube q1800  . chlorhexidine  15 mL Mouth Rinse BID  . Chlorhexidine Gluconate Cloth  6 each Topical Q0600  . feeding supplement (JEVITY 1.2 CAL)  1,000 mL Per Tube Q24H  . feeding supplement  60 mL Per Tube TID  . insulin aspart  0-15 Units Subcutaneous Q4H  . insulin glargine  20 Units Subcutaneous QHS  . multivitamin with minerals  1  tablet Per Tube Daily  . mupirocin ointment  1 application Nasal BID  . pantoprazole sodium  40 mg Per Tube Q24H  . potassium chloride  40 mEq Per Tube Once   PRN:  acetaminophen, acetaminophen, fentaNYL, labetalol  Diet:    NPO Activity:  Bedrest DVT Prophylaxis:  SCDs ordered  CLINICALLY SIGNIFICANT STUDIES Basic Metabolic Panel:   Recent Labs Lab 08/28/12 0530 08/29/12 0535  NA 140 144  K 3.3* 3.3*  CL 107 111  CO2 25 27  GLUCOSE 256* 233*  BUN 22 29*  CREATININE 0.69 0.62  CALCIUM 8.2* 8.7   Liver Function Tests:   Recent Labs Lab 09/05/2012 1905  AST 11  ALT 13  ALKPHOS 106  BILITOT 0.2*  PROT 6.7  ALBUMIN 3.4*   CBC:  Recent Labs Lab 08/29/2012 1905  08/26/12 0518  08/28/12 0530 08/29/12 0535  WBC 10.1  --  11.8*  < > 12.1* 9.5  NEUTROABS 7.9*  --  10.1*  --   --   --   HGB 14.2  < > 12.0*  < > 11.7* 12.2*  HCT 41.3  < > 34.9*  < > 34.4* 35.9*  MCV 87.9  --  86.8  < > 88.2 87.8  PLT 268  --  233  < > 179 211  < > = values in this interval not displayed. Coagulation:   Recent Labs Lab  1905  LABPROT 12.5  INR 0.94   Cardiac Enzymes:   Recent Labs Lab 08/16/2012 1905  TROPONINI <0.30   Urinalysis:   Recent Labs Lab 09/12/2012 1918  COLORURINE YELLOW  LABSPEC 1.015  PHURINE 6.0  GLUCOSEU >1000*  HGBUR NEGATIVE  BILIRUBINUR NEGATIVE  KETONESUR NEGATIVE  PROTEINUR NEGATIVE  UROBILINOGEN 0.2  NITRITE NEGATIVE  LEUKOCYTESUR NEGATIVE   Lipid Panel    Component Value Date/Time   CHOL 82 08/26/2012 0518   TRIG 96 08/28/2012 0530   HDL 35* 08/26/2012 0518   CHOLHDL 2.3 08/26/2012 0518   VLDL 12 08/26/2012 0518   LDLCALC 35 08/26/2012 0518   HgbA1C  Lab Results  Component Value Date   HGBA1C 8.5* 08/26/2012    Urine Drug Screen:     Component Value Date/Time   LABOPIA NONE DETECTED 09/10/2012 1918   COCAINSCRNUR NONE DETECTED 08/15/2012 1918   LABBENZ NONE DETECTED 09/04/2012 1918   AMPHETMU NONE DETECTED 09/10/2012 1918   THCU  NONE DETECTED 08/23/2012 1918   LABBARB NONE DETECTED 08/19/2012 1918    Alcohol Level:   Recent Labs Lab 08/22/2012 1905  ETH <11   CT of the brain   08/29/2012   Motion degraded exam.  Subtle hypodensity posterior right opercular region.  Acute infarct not excluded.  Motion limits evaluation.  Bifrontal encephalomalacia result of prior trauma.  Mild polypoid opacification in the left maxillary sinus.  Mild mucosal thickening left ethmoid sinus air cells.  08/27/2012 Significant interval progression of changes since previous study with large infarction not demonstrated in the right cerebral hemisphere. Interval development of increased edema and mass effect with 8 mm right-to-left midline shift. Interval development of intraparenchymal and subarachnoid hemorrhage in the right cerebral hemisphere and focally in the left convexity. Intraventricular hemorrhage in the left lateral ventricle  Cerebral angiogram S/P bilateral common carotid arteriograms and lt vertebral arteriogram. Rt CFA approach.  Findings:  1.Occluded RT MCA inf divison angular branch and RT ACA A2 seg . 2.Large area of hypoperfusion in the Rt parietal cortical/subcortical region    MRI of the brain   Large combined right MCA and ACA acute infarct, some sparing of the anterior right MCA territory.  Malignant hemorrhagic transformation of the infarct. Parenchymal and subarachnoid hemorrhage present. Small volume of  intraventricular hemorrhage.Effacement of the right lateral ventricle and leftward midline shift of 8 mm. Basilar cisterns patent at this time  MRA of the brain  Posterior division right MCA branches appear diminutive with decreased flow signal compared to those on the left, but no proximal or major right MCA branch occlusion is identified. Attenuation of the right ACA A2 segment which appears to occlude at the level of the pericallosal artery. Anterior circulation elsewhere is within normal limits. Posterior circulation  atherosclerosis without high-grade stenosis or major branch occlusion.  2D Echocardiogram  EF 50% with diffuse HK  Carotid Doppler  Preliminary report: Bilateral: Less than 39% ICA stenosis. Vertebral artery flow is antegrade.   LE  Venous Dopplers: Bilateral: No evidence of DVT, superficial thrombosis, or Baker's Cyst.  CXR   08/26/2012    No interval change.  The lungs remain clear.  Endotracheal tube is unchanged in position.    08/26/2012    Appropriate positioning of endotracheal tube.  No acute pulmonary abnormalities.   EKG  normal sinus rhythm.  Therapy Recommendations PENDING  Physical Exam  General: The patient is intubated, sedated at the time of the examination.  Respiratory: Lung fields appear to be clear.  Abdomen: Abdomen is soft, nontender, positive bowel sounds.  Skin: No significant peripheral edema is noted.   Neurologic Exam  Cranial nerves:  The patient is intubated, sedated. The patient has eye deviation to the right. The patient is not following commands.  Motor: The patient is not following commands for motor testing. With sternal rub, the patient is decerebrate bilaterally.  Coordination: The patient will not cooperate for cerebellar testing.  Gait and station: The patient could not be ambulated, the patient is bedridden.  Reflexes: Deep tendon reflexes are symmetric. The patient has a Babinski bilaterally.   ASSESSMENT Mr. AMIN FORNWALT is a 79 y.o. male presenting with left hemiparesis at [facility]. Status post IV t-PA 09/12/2012 at 2015 at [facility], transferred to [facility] where cerebral angio was performed which showed a distal R MCA inferior division brain clot not amenable to intervention. Patient with right brain cortical and subcortical infarct.  Infarct felt to be embolic secondary to unknown source.  On no antithrombotics prior to admission, patient received tPA. After 12 hours post tPA patient now on no antithrombotics due to transformation  hemorrhage, subarachnoid hemorrhage within 24h of tpa administration for secondary stroke prevention. Patient with resultant VDRF, left hemiparesis, left facial droop, dysphagia, lethargy. Work up underway.   Post neuro intervention respiratory failure  Febrile, Tmax 101.2;  Presumed aspiration pneumonia, on unasyn Hypertension Hyperlipidemia, LDL 35, on crestor 20 PTA, now on lipitor in the hospital, goal LDL < 100 (< 70 for diabetics) Diabetes, HgbA1c 10.0 in April 2014, goal < 7.0 Hx SDH following fall March 2012 Right hip fracture 2 mos ago, no surgical intervention, currently in [facility] undergoing rehab, on 08/17/2012 was cleared for 50% NWB x 2 wks then progress. 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   Hospital day # 4  Patient is now decerebrate bilaterally, suggesting progression of his neurological injury. Prognosis is very poor, we'll need to discuss plans for management with the family.  TREATMENT/PLAN  No antitthrombotics given hemorrhagic transformations  CCM manage vent, extubation  Goal to normalize temperature for optimal stroke outcome  No CPR/defibrillation, but continue full medical care  Risk factor modification   Prognosis poor and likely need trach/peg/SNF and son to decide soon about this. The family wishes to have a meeting on Monday to help decide these issues.  WILLIS,CHARLES KEITH  08/29/2012 8:16 AM

## 2012-08-29 NOTE — Progress Notes (Signed)
ANTIBIOTIC CONSULT NOTE - FOLLOW UP  Pharmacy Consult for unasyn Indication: pneumonia  No Known Allergies  Patient Measurements: Height: 5\' 6"  (167.6 cm) Weight: 182 lb 8.7 oz (82.8 kg) IBW/kg (Calculated) : 63.8  Vital Signs: Temp: 99.7 F (37.6 C) (06/15 1205) Temp src: Oral (06/15 1205) BP: 167/53 mmHg (06/15 1100) Pulse Rate: 75 (06/15 1100) Intake/Output from previous day: 06/14 0701 - 06/15 0700 In: 1864.4 [I.V.:894.4; NG/GT:670; IV Piggyback:300] Out: 2665 [Urine:2665] Intake/Output from this shift: Total I/O In: 497.5 [I.V.:167.5; NG/GT:230; IV Piggyback:100] Out: 680 [Urine:680]  Labs:  Recent Labs  08/27/12 0600 08/28/12 0530 08/29/12 0535  WBC 12.2* 12.1* 9.5  HGB 11.5* 11.7* 12.2*  PLT 174 179 211  CREATININE 0.73 0.69 0.62   Estimated Creatinine Clearance: 79.3 ml/min (by C-G formula based on Cr of 0.62). No results found for this basename: VANCOTROUGH, VANCOPEAK, VANCORANDOM, GENTTROUGH, GENTPEAK, GENTRANDOM, TOBRATROUGH, TOBRAPEAK, TOBRARND, AMIKACINPEAK, AMIKACINTROU, AMIKACIN,  in the last 72 hours    Assessment: 79 yo male with recent fever and possible aspiration on unasyn for PNA coverage.  SCr= 0.62 and stable, WBC= 9.5, cultures NGTD. Patient noted for likley d/c unasyn today  6/12 unasyn>>  Sputum 6/12>> non-path flora Blood 6/12>>ngtd  Plan:  -No unasyn dose changes needed -Will follow plans for LOT  Harland German, Pharm D 08/29/2012 1:06 PM

## 2012-08-30 ENCOUNTER — Inpatient Hospital Stay (HOSPITAL_COMMUNITY): Payer: Medicare Other

## 2012-08-30 LAB — PHOSPHORUS: Phosphorus: 2.5 mg/dL (ref 2.3–4.6)

## 2012-08-30 LAB — GLUCOSE, CAPILLARY
Glucose-Capillary: 263 mg/dL — ABNORMAL HIGH (ref 70–99)
Glucose-Capillary: 207 mg/dL — ABNORMAL HIGH (ref 70–99)
Glucose-Capillary: 235 mg/dL — ABNORMAL HIGH (ref 70–99)

## 2012-08-30 LAB — BASIC METABOLIC PANEL
CO2: 25 mEq/L (ref 19–32)
GFR calc non Af Amer: >90 mL/min (ref >90)
Glucose, Bld: 242 mg/dL — ABNORMAL HIGH (ref 70–99)
Potassium: 3.7 mEq/L (ref 3.5–5.1)
Sodium: 144 mEq/L (ref 135–145)

## 2012-08-30 MED ORDER — MORPHINE SULFATE 25 MG/ML IV SOLN
1.0000 mg/h | INTRAVENOUS | Status: DC
  Administered 2012-08-30 – 2012-09-01 (×4): 15 mg/h via INTRAVENOUS
  Filled 2012-08-30 (×10): qty 10

## 2012-08-30 MED ORDER — MORPHINE BOLUS VIA INFUSION
5.0000 mg | INTRAVENOUS | Status: DC | PRN
  Administered 2012-08-30 (×3): 5 mg via INTRAVENOUS
  Filled 2012-08-30: qty 20

## 2012-08-30 MED ORDER — SODIUM CHLORIDE 0.9 % IV SOLN
5.0000 mg/h | INTRAVENOUS | Status: DC
  Administered 2012-08-30: 5 mg/h via INTRAVENOUS
  Filled 2012-08-30: qty 10

## 2012-08-30 MED ORDER — SODIUM CHLORIDE 0.9 % IV SOLN: 1.0000 mg/h | INTRAVENOUS | Status: DC

## 2012-08-30 NOTE — Progress Notes (Signed)
Patient made comfort care, family aware and at bedside.  Support given to family.  Ordered Morphine drip for patient comfort and awaiting family for extubation

## 2012-08-30 NOTE — Progress Notes (Signed)
I have met with the family today, and the 2 POA members ( Son and daughter) desire comfort care measures. This will be initiated at this point.

## 2012-08-30 NOTE — Progress Notes (Signed)
PULMONARY  / CRITICAL CARE MEDICINE  Name: Garrett [name] MRN: [id number] DOB: 1933-05-06    ADMISSION DATE:  Sep 11, 2012 CONSULTATION DATE:  September 11, 2012  REFERRING MD :  Amada Jupiter PRIMARY SERVICE: Neurology  CHIEF COMPLAINT:  Stroke, Vent management  BRIEF PATIENT DESCRIPTION:  79 yo male former smoker presented to [facility] with altered mental status with Lt facial droop and Lt sided weakness >> given tPA w/o improvement.  Transferred to [facility] for tx with neuro IR >> intubated for procedure, and PCCM consulted to assist with vent/medical management.   SIGNIFICANT EVENTS:  6/11 [facility] ED for stroke symptoms > tpa, neuro IR procedure 6/12 Fever, failed SBT  STUDIES: 6/11 cerebral angiogram> occluded Rt MCA inf division branch and R ACA A2 seg; large area of hypoperfusion in the R parietal cortical/subcortical region  6/12 Echo >> EF 50%, diffuse hypokinesis, grade 1 diastolic dysfx 6/12 MRI brain >> Large ACA/MCA infarct with hemorrhagic transformation and SAH, 8 mm midline shift 6/12 CT head >> increased edema and mass effect with 8 mm Rt to Lt shift, SAH  LINES / TUBES: 6/11 ETT >> 6/11 R fem arterial sheath >> 6/12  CULTURES: Sputum 6/12 >> no organisms Blood 6/12 >>   ANTIBIOTICS: Unasyn 6/12 >>   SUBJECTIVE:  febrile  Decreased secretions.  VITAL SIGNS: Temp:  [98.1 F (36.7 C)-101.2 F (38.4 C)] 100 F (37.8 C) (06/16 0844) Pulse Rate:  [62-80] 70 (06/16 1000) Resp:  [15-31] 18 (06/16 1000) BP: (121-193)/(54-86) 151/62 mmHg (06/16 1000) SpO2:  [96 %-99 %] 98 % (06/16 1000) FiO2 (%):  [30 %] 30 % (06/16 0910) Weight:  [83.7 kg (184 lb 8.4 oz)] 83.7 kg (184 lb 8.4 oz) (06/16 0500) VENTILATOR SETTINGS: Vent Mode:  [-] PRVC FiO2 (%):  [30 %] 30 % Set Rate:  [16 bmp] 16 bmp Vt Set:  [560 mL] 560 mL PEEP:  [5 cmH20] 5 cmH20 Pressure Support:  [14 cmH20] 14 cmH20 Plateau Pressure:  [13 cmH20-20 cmH20] 17 cmH20 INTAKE / OUTPUT: Intake/Output     06/15 0701 - 06/16 0700  06/16 0701 - 06/17 0700   I.V. (mL/kg) 900 (10.8) 40 (0.5)   NG/GT 1225 40   IV Piggyback 100    Total Intake(mL/kg) 2225 (26.6) 80 (1)   Urine (mL/kg/hr) 2610 (1.3) 225 (0.7)   Total Output 2610 225   Net -385 -145        Stool Occurrence 1 x      PHYSICAL EXAMINATION:  Gen: Sedated, no purposeful movement  HEENT: OG, ETT in place PULM: scattered rhonchi CV: regular, no murmur AB: BS+, soft, nontender Ext: no edema Derm: chronic venous stasis changes Neuro:unresponsive, decerebrate posturing   LABS:  Recent Labs Lab 08/28/12 0530 08/29/12 0535 08/30/12 0425  NA 140 144 144  K 3.3* 3.3* 3.7  CL 107 111 112  CO2 25 27 25   BUN 22 29* 33*  CREATININE 0.69 0.62 0.64  GLUCOSE 256* 233* 242*    Recent Labs Lab 08/27/12 0600 08/28/12 0530 08/29/12 0535  HGB 11.5* 11.7* 12.2*  HCT 33.9* 34.4* 35.9*  WBC 12.2* 12.1* 9.5  PLT 174 179 211     Recent Labs Lab 08/29/12 1608 08/29/12 2034 08/30/12 0017 08/30/12 0411 08/30/12 0839  GLUCAP 232* 263* 256* 207* 235*    Imaging: PCXR: left base ATX.No sig change in overall aeration  Lipid Panel     Component Value Date/Time   CHOL 82 08/26/2012 0518   TRIG 96 08/28/2012 0530  HDL 35* 08/26/2012 0518   CHOLHDL 2.3 08/26/2012 0518   VLDL 12 08/26/2012 0518   LDLCALC 35 08/26/2012 0518      ASSESSMENT / PLAN:  NEUROLOGIC A:   Acute ischemic stroke (R MCA/ACA) s/p tPA >> IR clot removal attempted, but unsuccessful due to location of clot >> complicated by progression of CVA with ICH and SAH on MRI 6/12. Less purposeful today.  P:   - Planned family meeting with neurology 6/16 - withdrawal planned  PULMONARY A:  Post procedure respiratory failure and possible aspiration pneumonia. Tolerates PSV for short perioda but MS will not allow for airway protection  P:   -withdrawal of vent support protocol   CARDIOVASCULAR A: Hx of hypertension, hyperlipidemia, TAA. P:  -prn labetalol -dc all  meds  RENAL A:  Hypokalemia  P:   -Replete electrolytes  GASTROINTESTINAL A:  Nutrition. P:   -PPI for SUP -dc tube feeds   HEMATOLOGIC A: No issues. P:  -SCD  INFECTIOUS A:  Fever with possible aspiration vs central in setting of CVA with ICH/SAH. Lower extremity venous stasis ulcers. P:   -f/u blood cultures from 6/12 - D/C unasyn -wound care   ENDOCRINE A:  DM type II with hyperglycemia. P:   -dc lantus & SSI     GOALS OF CARE >> No CPR/defibrillation, but continue full medical care.  Prognosis for meaningful neuro recovery seems poor >> family decidied on full comfort care, orders adjusted to reflect full comfort.  Cyril Mourning MD. Tonny Bollman. [place] [facility] Pager [phone] If no response call [phone]         08/30/2012 11:08 AM     STAFF NOTE: I, Dr Lavinia Sharps have personally reviewed patient's available data, including medical history, events of note, physical examination and test results as part of my evaluation. I have discussed with resident/NP and other care providers such as pharmacist, RN and RRT.  In addition,  I personally evaluated patient and elicited key findings ofoday's Summary: Pt remains unresponsive with poor neurologic examination. Will adjust insulin for hyperglycemia. Family plans to meet with neurology 6/16 to discuss goals of care. I updated  daughter and granddaughter.  Marland Kitchen  Rest per NP/medical resident whose note is outlined above and that I agree with     Dr. Kalman Shan, M.D., [facility].C.P Pulmonary and Critical Care Medicine Staff Physician [facility] [place] [facility] Pager: [phone], If no answer or between  15:00h - 7:00h: call [phone]  08/30/2012 11:08 AM

## 2012-08-30 NOTE — Progress Notes (Signed)
Garrett [name],PT Acute Rehabilitation [phone] (pager)  

## 2012-08-30 NOTE — Procedures (Signed)
Extubation Procedure Note  Patient Details:   Name: Garrett [name] DOB: 07-21-1933 MRN: [id number]    Weaned on PS until patient was comfortable.  Terminally extubated per MD.  Mild distress noted, RN notified to increase morphine.  RT will monitor.    Evaluation  O2 sats: transiently fell during during procedure Complications: No apparent complications Patient did not tolerate procedure well. Bilateral Breath Sounds: Clear;Diminished Suctioning: Airway No  Harley Hallmark 08/30/2012, 11:17 AM

## 2012-08-30 NOTE — Progress Notes (Signed)
Chaplain responded to message from unit secretary about pending withdrawal of care. Approaching the room, I could see a large family gathered. I asked the man nearest the door whether a pastor was present with them and he said he was the pastor. That being the case, I left the family in his care.

## 2012-08-30 NOTE — Progress Notes (Signed)
Patient ID: Garrett [name], male   DOB: [birth year], 79 y.o.   MRN: [id number]           PROGRESS NOTE  DATE:  08/18/2012  FACILITY: [facility]   LEVEL OF CARE:   SNF   Acute Visit   CHIEF COMPLAINT:  Lower extremity wounds.    HISTORY OF PRESENT ILLNESS:  I actually saw Garrett [name], who is a gentleman who came to [place] after suffering a right acetabular fracture, on 07/28/2012.  On that date, I was seeing him for erythema of his legs.  He had brawny erythema of both legs with minimal edema.  I thought he had chronic venous stasis with erythema.  I put him on TED hose.    He is noted to have two open areas now, one on the anterior right leg and one on the posterior left.  At least the anterior leg may have been trauma.  There is some fear that he has cellulitis.  I am really doubtful that this is the case.    PHYSICAL EXAMINATION:   CIRCULATION:   ARTERIAL:  Peripheral pulses are reduced, but palpable.   EDEMA/VARICOSITIES:  He does have brawny erythema of both legs bilaterally.  I suspect this is all chronic venous stasis.   SKIN:  INSPECTION:  He tells me he has had wounds on his legs in the past and went to the "wound care center".  The wounds on his legs are superficial, but will definitely need to be dressed and will need some form of compression wrap.     ASSESSMENT/PLAN:  Venous stasis with venous stasis ulceration.  I am doubtful this is cellulitis.  However, his clinical differentiation is difficult.  He was put on Levaquin two days ago.  I think it is reasonable to continue this.  We will dress his wounds in silver alginate, Kerlix/Coban, change every second day.    CPT CODE: 78295

## 2012-08-30 NOTE — Progress Notes (Signed)
Stroke Team Progress Note  HISTORY Garrett [name] is a 79 y.o. male resident of [facility] who was last seen normal at 4 PM on 08/29/2012 and then was found around 5pm and presented to [facility] [place] with left sided weakness. Telestroke was activated, but after a delay, the patient was discussed with Dr. Amada Jupiter over the phone and the decision was made to give tPA within the 4.5 hour window.   He was transferred to [facility] for further evaluation and possible intervention. He has previously stated that in the event of a cardiac arrest, he would not want resuscitation but has specifying that he would want intubation for procedures. After discussing with his daughter, the decision was made to go ahead with intervention and he was taken to IR.  He was in a facility undergoing rehab for hip surgery(performed 2 months ago)   SUBJECTIVE The patient is intubated. No family is in the room.  OBJECTIVE Most recent Vital Signs: Filed Vitals:   08/30/12 0300 08/30/12 0400 08/30/12 0500 08/30/12 0600  BP: 131/58 148/58 135/72 145/61  Pulse: 64 64 66 63  Temp:  98.1 F (36.7 C)    TempSrc:  Axillary    Resp: 18 20 20 18   Height:      Weight:   184 lb 8.4 oz (83.7 kg)   SpO2: 98% 99% 99% 99%   CBG (last 3)   Recent Labs  08/29/12 2034 08/30/12 0017 08/30/12 0411  GLUCAP 263* 256* 207*    IV Fluid Intake:   . sodium chloride 30 mL/hr at 08/29/12 1739  . propofol 20 mcg/kg/min (08/30/12 0600)    MEDICATIONS  . antiseptic oral rinse  15 mL Mouth Rinse QID  . atorvastatin  40 mg Per Tube q1800  . chlorhexidine  15 mL Mouth Rinse BID  . Chlorhexidine Gluconate Cloth  6 each Topical Q0600  . feeding supplement (JEVITY 1.2 CAL)  1,000 mL Per Tube Q24H  . feeding supplement  60 mL Per Tube TID  . insulin aspart  0-15 Units Subcutaneous Q4H  . insulin glargine  40 Units Subcutaneous QHS  . multivitamin with minerals  1 tablet Per Tube Daily  . mupirocin ointment   1 application Nasal BID  . pantoprazole sodium  40 mg Per Tube Q24H   PRN:  acetaminophen, acetaminophen, fentaNYL, labetalol  Diet:    NPO tube feeds Activity:  Bedrest DVT Prophylaxis:  SCDs ordered  CLINICALLY SIGNIFICANT STUDIES Basic Metabolic Panel:   Recent Labs Lab 08/29/12 0535 08/30/12 0425  NA 144 144  K 3.3* 3.7  CL 111 112  CO2 27 25  GLUCOSE 233* 242*  BUN 29* 33*  CREATININE 0.62 0.64  CALCIUM 8.7 8.9  MG  --  2.4  PHOS  --  2.5   Liver Function Tests:   Recent Labs Lab 09/12/2012 1905  AST 11  ALT 13  ALKPHOS 106  BILITOT 0.2*  PROT 6.7  ALBUMIN 3.4*   CBC:  Recent Labs Lab 09/06/2012 1905  08/26/12 0518  08/28/12 0530 08/29/12 0535  WBC 10.1  --  11.8*  < > 12.1* 9.5  NEUTROABS 7.9*  --  10.1*  --   --   --   HGB 14.2  < > 12.0*  < > 11.7* 12.2*  HCT 41.3  < > 34.9*  < > 34.4* 35.9*  MCV 87.9  --  86.8  < > 88.2 87.8  PLT 268  --  233  < >  179 211  < > = values in this interval not displayed. Coagulation:   Recent Labs Lab 08/29/2012 1905  LABPROT 12.5  INR 0.94   Cardiac Enzymes:   Recent Labs Lab 09/05/2012 1905  TROPONINI <0.30   Urinalysis:   Recent Labs Lab 09/06/2012 1918  COLORURINE YELLOW  LABSPEC 1.015  PHURINE 6.0  GLUCOSEU >1000*  HGBUR NEGATIVE  BILIRUBINUR NEGATIVE  KETONESUR NEGATIVE  PROTEINUR NEGATIVE  UROBILINOGEN 0.2  NITRITE NEGATIVE  LEUKOCYTESUR NEGATIVE   Lipid Panel    Component Value Date/Time   CHOL 82 08/26/2012 0518   TRIG 96 08/28/2012 0530   HDL 35* 08/26/2012 0518   CHOLHDL 2.3 08/26/2012 0518   VLDL 12 08/26/2012 0518   LDLCALC 35 08/26/2012 0518   HgbA1C  Lab Results  Component Value Date   HGBA1C 8.5* 08/26/2012    Urine Drug Screen:     Component Value Date/Time   LABOPIA NONE DETECTED 09/12/2012 1918   COCAINSCRNUR NONE DETECTED 08/18/2012 1918   LABBENZ NONE DETECTED  1918   AMPHETMU NONE DETECTED 08/30/2012 1918   THCU NONE DETECTED 09/12/2012 1918   LABBARB NONE  DETECTED 09/12/2012 1918    Alcohol Level:   Recent Labs Lab 08/22/2012 1905  ETH <11   CT of the brain   09/05/2012   Motion degraded exam.  Subtle hypodensity posterior right opercular region.  Acute infarct not excluded.  Motion limits evaluation.  Bifrontal encephalomalacia result of prior trauma.  Mild polypoid opacification in the left maxillary sinus.  Mild mucosal thickening left ethmoid sinus air cells.  08/27/2012 Significant interval progression of changes since previous study with large infarction not demonstrated in the right cerebral hemisphere. Interval development of increased edema and mass effect with 8 mm right-to-left midline shift. Interval development of intraparenchymal and subarachnoid hemorrhage in the right cerebral hemisphere and focally in the left convexity. Intraventricular hemorrhage in the left lateral ventricle  Cerebral angiogram S/P bilateral common carotid arteriograms and lt vertebral arteriogram. Rt CFA approach.  Findings:  1.Occluded RT MCA inf divison angular branch and RT ACA A2 seg . 2.Large area of hypoperfusion in the Rt parietal cortical/subcortical region    MRI of the brain   Large combined right MCA and ACA acute infarct, some sparing of the anterior right MCA territory.  Malignant hemorrhagic transformation of the infarct. Parenchymal and subarachnoid hemorrhage present. Small volume of  intraventricular hemorrhage.Effacement of the right lateral ventricle and leftward midline shift of 8 mm. Basilar cisterns patent at this time  MRA of the brain  Posterior division right MCA branches appear diminutive with decreased flow signal compared to those on the left, but no proximal or major right MCA branch occlusion is identified. Attenuation of the right ACA A2 segment which appears to occlude at the level of the pericallosal artery. Anterior circulation elsewhere is within normal limits. Posterior circulation atherosclerosis without high-grade stenosis or  major branch occlusion.  2D Echocardiogram  EF 50% with diffuse HK  Carotid Doppler  Preliminary report: Bilateral: Less than 39% ICA stenosis. Vertebral artery flow is antegrade.   LE  Venous Dopplers: Bilateral: No evidence of DVT, superficial thrombosis, or Baker's Cyst.  CXR   08/26/2012    No interval change.  The lungs remain clear.  Endotracheal tube is unchanged in position.    08/26/2012    Appropriate positioning of endotracheal tube.  No acute pulmonary abnormalities.   EKG  normal sinus rhythm.   Therapy Recommendations PENDING  Physical Exam  General:  The patient is intubated, sedated at the time of the examination.  Respiratory: Lung fields appear to be clear.  Abdomen: Abdomen is soft, nontender, positive bowel sounds.  Skin: No significant peripheral edema is noted.   Neurologic Exam  Cranial nerves:  The patient is intubated, sedated. The patient has eye deviation to the right. The patient is not following commands.  Motor: The patient is not following commands for motor testing. With sternal rub, the patient is decerebrate bilaterally.  Coordination: The patient will not cooperate for cerebellar testing.  Gait and station: The patient could not be ambulated, the patient is bedridden.  Reflexes: Deep tendon reflexes are symmetric. The patient has a Babinski bilaterally.   ASSESSMENT Mr. Garrett [name] is a 79 y.o. male presenting with left hemiparesis at [facility]. Status post IV t-PA 09/09/2012 at 2015 at [facility], transferred to [facility] where cerebral angio was performed which showed a distal R MCA inferior division brain clot not amenable to intervention. Patient with right brain cortical and subcortical infarct.  Infarct felt to be embolic secondary to unknown source.  On no antithrombotics prior to admission, patient received tPA. After 12 hours post tPA patient now on no antithrombotics due to transformation hemorrhage, subarachnoid hemorrhage within 24h  of tpa administration for secondary stroke prevention. Patient with resultant VDRF, left hemiparesis, left facial droop, dysphagia, lethargy. Work up underway.   Post neuro intervention respiratory failure  Febrile, Tmax 101.2;  Presumed aspiration pneumonia, on unasyn Hypertension Hyperlipidemia, LDL 35, on crestor 20 PTA, now on lipitor in the hospital, goal LDL < 100 (< 70 for diabetics) Diabetes, HgbA1c 10.0 in April 2014, goal < 7.0 Hx SDH following fall March 2012 Right hip fracture 2 mos ago, no surgical intervention, currently in [facility] undergoing rehab, on 08/17/2012 was cleared for 50% NWB x 2 wks then progress. 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   Hospital day # 5  Patient is now decerebrate bilaterally, suggesting progression of his neurological injury. Prognosis is very poor, we'll need to discuss plans for management with the family. Comfort care measures may be reasonable. Meeting planned for today.  TREATMENT/PLAN  No antitthrombotics given hemorrhagic transformations  CCM manage vent, extubation  Goal to normalize temperature for optimal stroke outcome  No CPR/defibrillation, but continue full medical care  Risk factor modification   Prognosis poor and likely need trach/peg/SNF and son to decide soon about this. The family wishes to have a meeting on today to help decide these issues.  Hazelee Harbold KEITH  08/30/2012 7:01 AM

## 2012-08-30 NOTE — Progress Notes (Signed)
PT/OT Cancellation Note  Patient Details Name: SHANNAN GARFINKEL MRN: [id number] DOB: 02/23/1934   Cancelled Treatment:    Reason Eval/Treat Not Completed:  Pt remains on ventilator with poor prognosis.  Family has decided on comfort care.  Signing off.  Evern Bio 08/30/2012, 10:53 AM

## 2012-08-30 NOTE — Progress Notes (Signed)
UR completed 

## 2012-08-30 NOTE — Progress Notes (Signed)
Patient extubated with family present at bedside.  Propofol turned off and Morphine drip started and titrated for patient comfort.

## 2012-08-31 NOTE — Progress Notes (Signed)
Stroke Team Progress Note  HISTORY Garrett [name] is a 79 y.o. male resident of [facility] who was last seen normal at 4 PM on 2012/09/05 and then was found around 5pm and presented to [facility] [place] with left sided weakness. Telestroke was activated, but after a delay, the patient was discussed with Dr. Amada Jupiter over the phone and the decision was made to give tPA within the 4.5 hour window.   He was transferred to [facility] for further evaluation and possible intervention. He has previously stated that in the event of a cardiac arrest, he would not want resuscitation but has specifying that he would want intubation for procedures. After discussing with his daughter, the decision was made to go ahead with intervention and he was taken to IR.  He was in a facility undergoing rehab for hip surgery(performed 2 months ago)   SUBJECTIVE Family at bedside. Reports pt with temperature. Feel he is comfortable.   OBJECTIVE Most recent Vital Signs: Filed Vitals:   08/30/12 1700 08/30/12 1800 08/31/12 0509 08/31/12 0842  BP: 112/57 112/52 95/53 108/63  Pulse: 67 75 84 76  Temp:   104.2 F (40.1 C) 103.8 F (39.9 C)  TempSrc:   Axillary Axillary  Resp: 15 20 12 16   Height:      Weight:      SpO2: 100% 100% 94% 92%   IV Fluid Intake:   . morphine 15 mg/hr (08/31/12 0804)    MEDICATIONS  . mupirocin ointment  1 application Nasal BID   PRN:  acetaminophen, morphine  Diet:    NPO tube feeds Activity:  Bedrest DVT Prophylaxis:  SCDs ordered  Physical Exam  General: The patient is intubated, sedated at the time of the examination.  Respiratory: Lung fields appear to be associated with bilateral rhonchi. Long apneic episodes.  Abdomen: Abdomen is soft, nontender, positive bowel sounds.  Skin: No significant peripheral edema is noted.   Neurologic Exam  Cranial nerves:  The patient is intubated, sedated. The patient has eye deviation to the right. The patient  is not following commands.  Motor: The patient is not following commands for motor testing.  Coordination: The patient will not cooperate for cerebellar testing.  Gait and station: The patient could not be ambulated, the patient is bedridden.  Reflexes: Deep tendon reflexes are symmetric. The patient has a Babinski bilaterally.   ASSESSMENT Mr. Garrett [name] is a 79 y.o. male presenting with left hemiparesis at [facility]. Status post IV t-PA 2012-09-05 at 2015 at [facility], transferred to [facility] where cerebral angio was performed which showed a distal R MCA inferior division brain clot not amenable to intervention. Patient with right brain cortical and subcortical infarct.  Infarct felt to be embolic secondary to unknown source.  On no antithrombotics prior to admission, patient received tPA. After 12 hours post tPA patient now on no antithrombotics due to transformation hemorrhage, subarachnoid hemorrhage within 24h of tpa administration for secondary stroke prevention. Patient with resultant VDRF, left hemiparesis, left facial droop, dysphagia, lethargy. Given poor prognosis, family has opted for comft care. Terminal wean 08/30/2012 and transferred to the floor.   Post neuro intervention respiratory failure  Febrile, Tmax 101.2;  Presumed aspiration pneumonia, Hypertension Hyperlipidemia, LDL 35 Diabetes, HgbA1c 10.0 in April 2014, goal < 7.0 Hx SDH following fall March 2012 Right hip fracture 2 mos ago, no surgical intervention, currently in [facility] undergoing rehab, on 08/17/2012 was cleared for 50% NWB x 2  wks then progress. 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  Hospital day # 6  TREATMENT/PLAN  Extubated for comfort care yesterday. No on 6700. Good candidate for GIP.  Annie Main, MSN, RN, ANVP-BC, ANP-BC, [facility] Pager: [phone] 08/31/2012 9:44 AM  I have personally obtained a history, examined the patient, evaluated imaging results, and formulated the assessment and plan of care. I agree with the above.   Lesly Dukes

## 2012-08-31 NOTE — Progress Notes (Signed)
Chaplain Note: Visited with pt's family at bedside. Lengthy visit with pt's granddaughter before other family members returned to room.  Granddaughter shared pt's story. I enjoyed hearing about  another granddaughter who has been on a couple of mission trips to [place].  Family appears to be doing really well under the circumstances.  Will follow up as needed.  Rutherford Nail Chaplain [phone]

## 2012-08-31 NOTE — Progress Notes (Signed)
Nutrition Brief Note  Chart reviewed. Pt now transitioning to comfort care.  No further nutrition interventions warranted at this time.  Please re-consult as needed.   Halley Kincer RD, LDN, CNSC [phone] Pager [phone] After Hours Pager    

## 2012-09-01 LAB — CULTURE, BLOOD (ROUTINE X 2): Culture: NO GROWTH

## 2012-09-01 NOTE — Progress Notes (Signed)
Stroke Team Progress Note  HISTORY Garrett [name] is a 79 y.o. male resident of [facility] who was last seen normal at 4 PM on 08/24/2012 and then was found around 5pm and presented to [facility] [place] with left sided weakness. Telestroke was activated, but after a delay, the patient was discussed with Dr. Amada Jupiter over the phone and the decision was made to give tPA within the 4.5 hour window.   He was transferred to [facility] for further evaluation and possible intervention. He has previously stated that in the event of a cardiac arrest, he would not want resuscitation but has specifying that he would want intubation for procedures. After discussing with his daughter, the decision was made to go ahead with intervention and he was taken to IR.  He was in a facility undergoing rehab for hip surgery(performed 2 months ago)   SUBJECTIVE Multiple family members at the bedside.  OBJECTIVE Most recent Vital Signs: Filed Vitals:   08/30/12 1800 08/31/12 0509 08/31/12 0842 08/22/2012 0446  BP: 112/52 95/53 108/63 103/58  Pulse: 75 84 76 81  Temp:  104.2 F (40.1 C) 103.8 F (39.9 C) 103 F (39.4 C)  TempSrc:  Axillary Axillary Oral  Resp: 20 12 16 16   Height:      Weight:      SpO2: 100% 94% 92% 97%   IV Fluid Intake:   . morphine 15 mg/hr (09/08/2012 0329)    MEDICATIONS    PRN:  acetaminophen, morphine  Diet:    NPO tube feeds Activity:  Bedrest DVT Prophylaxis:  SCDs ordered  Physical Exam  General: The patient is intubated, sedated at the time of the examination.  Respiratory: Lung fields appear to be associated with bilateral rhonchi. Long apneic episodes.  Abdomen: Abdomen is soft, nontender, positive bowel sounds.  Skin: No significant peripheral edema is noted.   Neurologic Exam  Cranial nerves:  The patient is intubated, sedated. The patient has eye deviation to the right. The patient is not following commands.  Motor: The patient is not  following commands for motor testing.  Coordination: The patient will not cooperate for cerebellar testing.  Gait and station: The patient could not be ambulated, the patient is bedridden.  Reflexes: Deep tendon reflexes are symmetric. The patient has a Babinski bilaterally.   ASSESSMENT Garrett [name] is a 79 y.o. male presenting with left hemiparesis at [facility]. Status post IV t-PA 09/07/2012 at 2015 at [facility], transferred to [facility] where cerebral angio was performed which showed a distal R MCA inferior division brain clot not amenable to intervention. Patient with right brain cortical and subcortical infarct.  Infarct felt to be embolic secondary to unknown source.  On no antithrombotics prior to admission, patient received tPA. After 12 hours post tPA patient now on no antithrombotics due to transformation hemorrhage, subarachnoid hemorrhage within 24h of tpa administration for secondary stroke prevention. Patient with resultant VDRF, left hemiparesis, left facial droop, dysphagia, lethargy. Given poor prognosis, family has opted for comft care. Terminal wean 08/30/2012 and transferred to the floor. Has apneic episodes, increasing in frequency and length, febrile. Comfort care underway.   Post neuro intervention respiratory failure  Febrile, Tmax 101.2;  Presumed aspiration pneumonia, Hypertension Hyperlipidemia, LDL 35 Diabetes, HgbA1c 10.0 in April 2014, goal < 7.0 Hx SDH following fall March 2012 Right hip fracture 2 mos ago, no surgical intervention, currently in [facility] undergoing rehab, on 08/17/2012 was cleared for 50% NWB x  2 wks then progress. 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  Hospital day # 7  TREATMENT/PLAN  Continue comfort care. Anticipate death within next 1-2 days. Good candidate for GIP if they will accept.  Garrett Main, MSN, RN, ANVP-BC, ANP-BC, [facility] Pager: [phone] 08/17/2012 9:01 AM  I have personally obtained a history, examined the patient, evaluated imaging results, and formulated the assessment and plan of care. I agree with the above. Garrett [name]

## 2012-09-14 NOTE — Progress Notes (Signed)
AC Kim called [employer] and house phone to attempt to notify Md of patient passing and she was unable to reach Md as well

## 2012-09-14 NOTE — Discharge Summary (Signed)
Garrett [name] is a 79 year old gentleman who was admitted to the hospital on 09/04/2012. The patient was found around 5 PM on the day, and presented to [facility] with left-sided weakness. The patient was given TPA, and was transferred to [facility]. The patient had a DO NOT RESUSCITATE status prior to transfer. The patient was in a rehabilitation facility, stroke following a hip surgery.  Past medical history is significant for  1. Diabetes  2. Hypertension  3. Dyslipidemia  4. History of subdural hematoma  5. History of thoracic aortic aneurysm  Surgical history includes:   1. Inguinal hernia repair    Hospital course: The patient was admitted to [facility] in transfer from [facility]. The patient received IV TPA, but unfortunately developed a hemorrhagic complications in the right brain. By MRI, the patient sustained a large right middle cerebral artery and anterior cerebral artery stroke. The patient had pulling at hemorrhagic transformation of infarct, resulting in right-to-left shift. MRA of the head showed decreased circulation in the right middle cerebral artery branches. Patient developed some subarachnoid hemorrhage as well with interventricular hemorrhage. After discussion with family, the decision was made to enter the patient into comfort care measures, and the patient was transferred to the hospital floor. The patient received IV morphine for comfort, and the tube feeds were stopped. The patient expired on 13-Sep-2012  At 08/06/24. The cause of death was secondary to cardiorespiratory collapse secondary to a right brain stroke with hemorrhagic conversion associated with TPA administration. The patient was discharged to the morgue.

## 2012-09-14 NOTE — Progress Notes (Signed)
Telephone call to Annie Main NP to make aware of patients passing last night. Also made aware of unsuccessful attempts to contact Dr. Thad Ranger to notify. Berk Pilot, Charlyne Quale

## 2012-09-14 NOTE — Progress Notes (Signed)
Called to patients room by family member, noted no respirations or heart sounds. Vitals taken and no BP, pulse. Extremities cold to touch. Confirmed findings with [place]il[place] Shi, RN . Time of death called at [phone], Dr. Thad Ranger on call for neurology called and texted paged  Multiple times for notification of  pt passing and made aware to sign death certificate. Confirmed with operator I was calling the correct number for MD that needed to be notified.  Have yet to receive call back to verify they received page and will sign certificate. [facility] notified. Reference Number [id number] pt  acceptable donor. [facility]eral home family requested documented. Body sent to morgue.

## 2012-09-14 DEATH — deceased

## 2012-09-28 ENCOUNTER — Ambulatory Visit: Payer: Medicare Other | Admitting: Orthopedic Surgery

## 2013-10-11 IMAGING — CR DG CHEST 1V PORT
1 series · 1 of 1 positions shown · non-contrast
Comparison: 07/07/2012

CLINICAL DATA: Cerebral infarction and status post intubation.

PORTABLE CHEST - 1 VIEW

[AP]
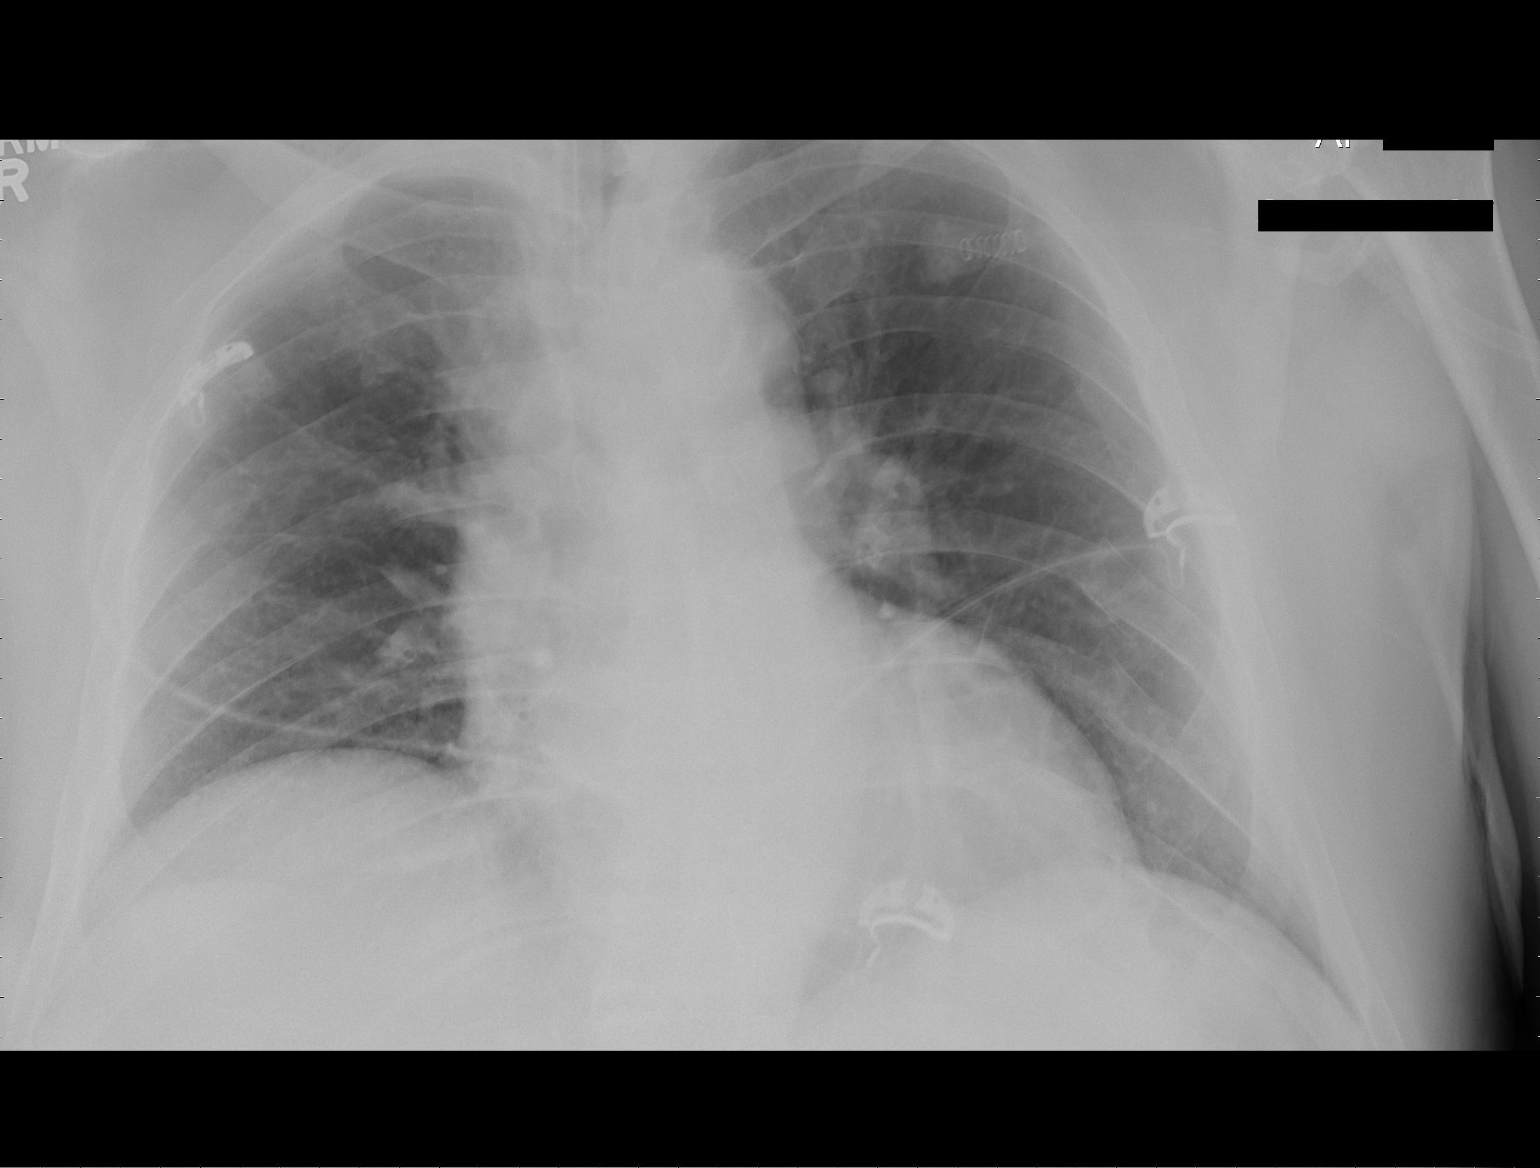

[1 of 1 positions shown; findings below may reference images not displayed]

FINDINGS: Endotracheal tube present with the tip approximately
cm above the carina.  Lungs show chronic elevation of the right
hemidiaphragm with volume loss.  No overt edema, focal
consolidation or pleural effusion is identified.  Heart size is
stable and at the upper limits of normal.
IMPRESSION: Appropriate positioning of endotracheal tube.  No acute pulmonary
abnormalities.

## 2013-10-13 IMAGING — CR DG CHEST 1V PORT
1 series · 1 of 1 positions shown · non-contrast
Comparison: 08/26/2012

CLINICAL DATA: Follow up pneumonia

PORTABLE CHEST - 1 VIEW

[AP]
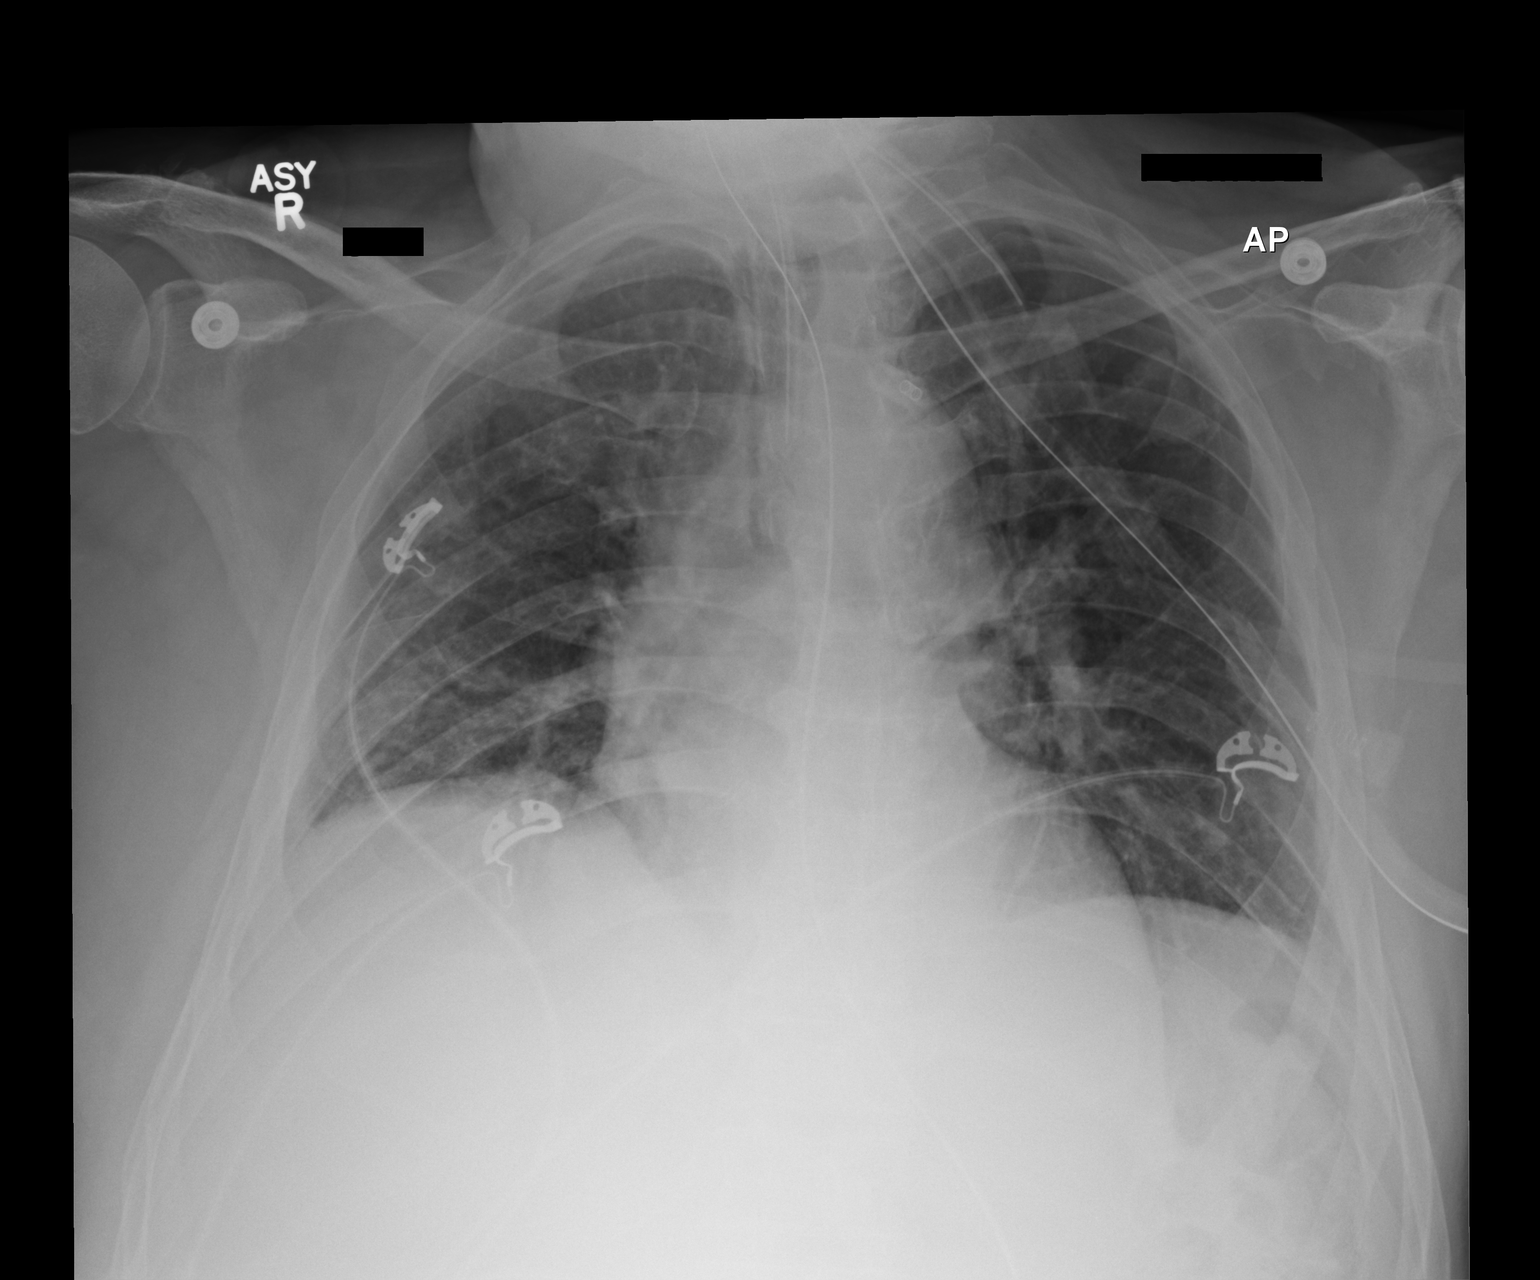

[1 of 1 positions shown; findings below may reference images not displayed]

FINDINGS: Endotracheal tube terminates 3.5 cm above the carina.

Enteric tube courses below the diaphragm.

Low lung volumes with vascular crowding and chronic interstitial
markings.  No frank interstitial edema.  Mild bibasilar opacities,
likely atelectasis.  No pleural effusion or pneumothorax.

Mild cardiomegaly.

Left rib fracture deformities.
IMPRESSION: Endotracheal tube terminates 2.5 cm above the carina.

Low lung volumes with mild lower lobe atelectasis.  No frank
interstitial edema.

## 2013-10-13 IMAGING — CT CT HEAD W/O CM
1 of 2 series · 15 of 30 positions shown, 19 images · non-contrast
Comparison: CT head 08/25/2012.  MRI brain 08/26/2012.

CLINICAL DATA: Follow-up stroke.

CT HEAD WITHOUT CONTRAST
TECHNIQUE: Contiguous axial images were obtained from the base of
the skull through the vertex without contrast.

[Series 3: head trauma 2.4 h60s · axial · 0.47mm/px · z∈[-118,+37]mm · 15 of 72 slices shown, 19 images]
[im 4/72  brain]
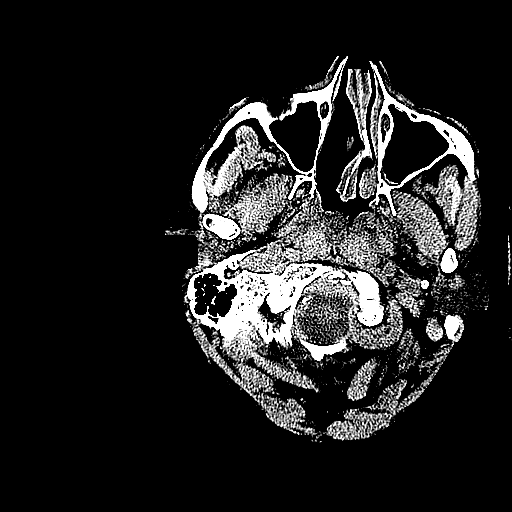
[im 4/72  bone]
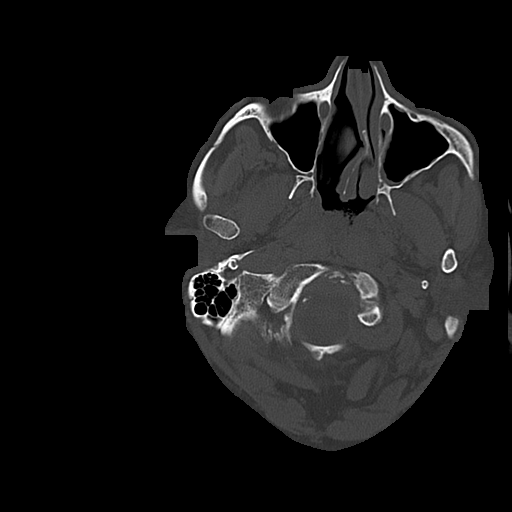
[im 8/72  brain]
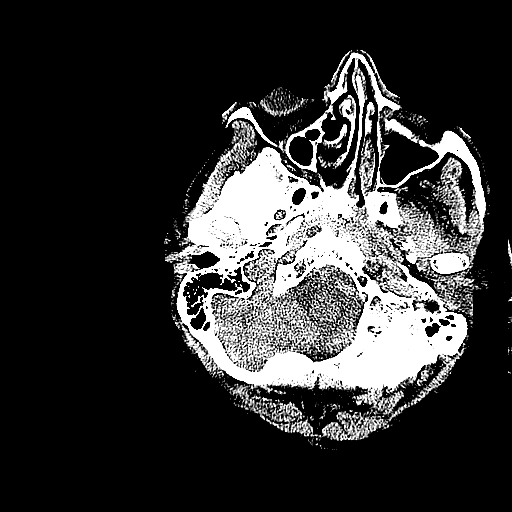
[im 15/72  brain]
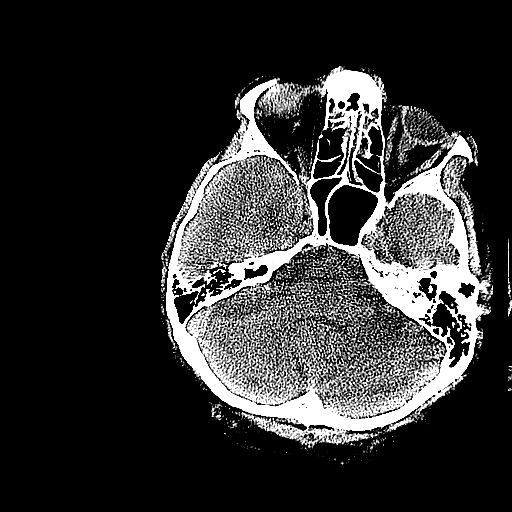
[im 19/72  brain]
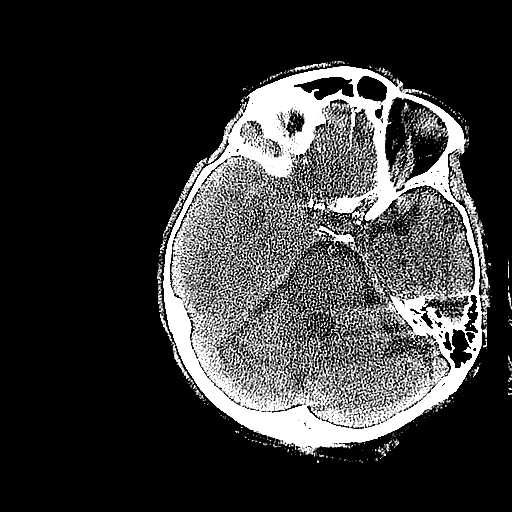
[im 23/72  brain]
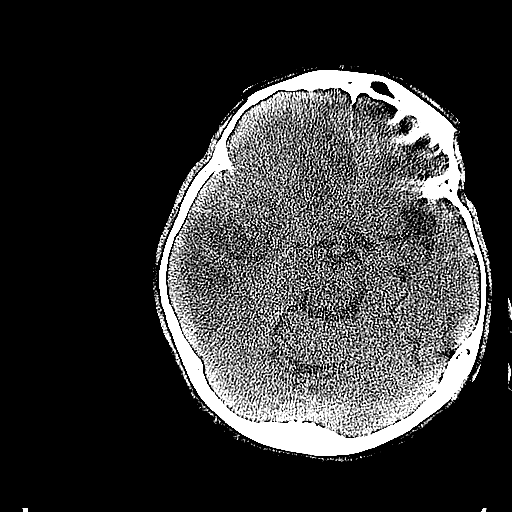
[im 23/72  bone]
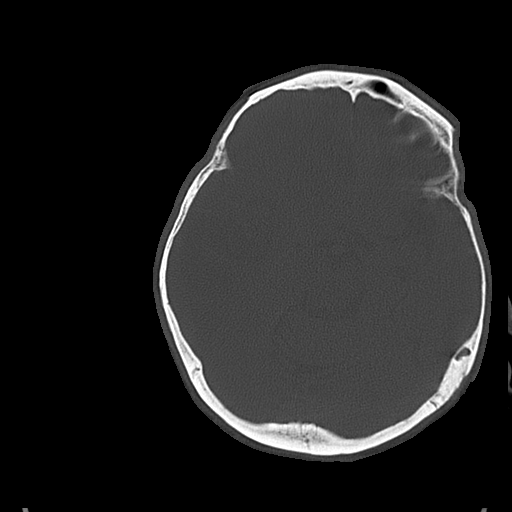
[im 27/72  brain]
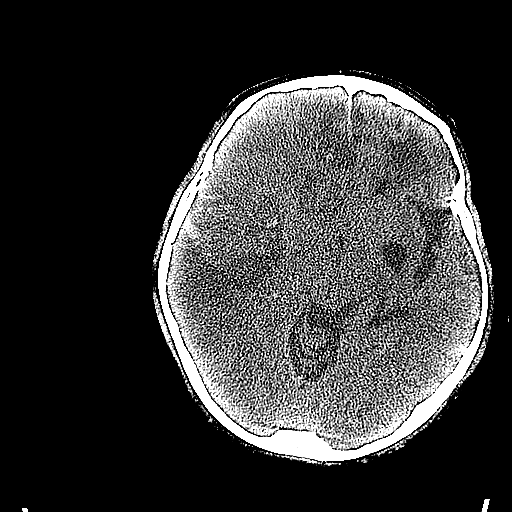
[im 30/72  brain]
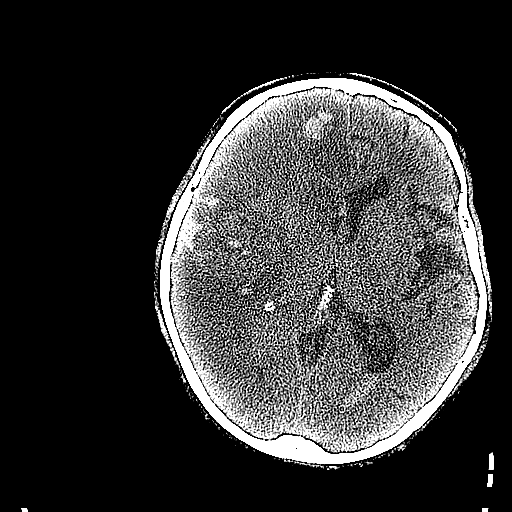
[im 38/72  brain]
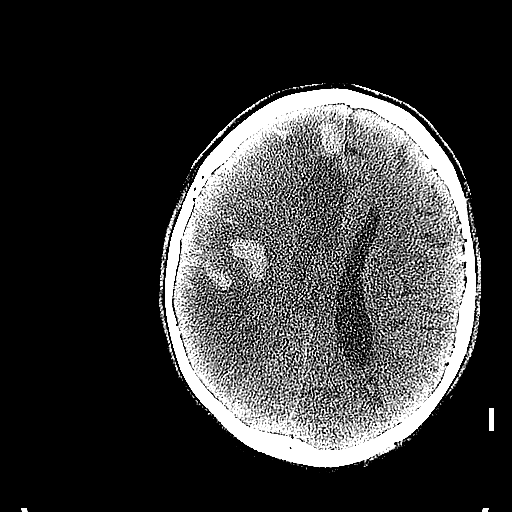
[im 42/72  brain]
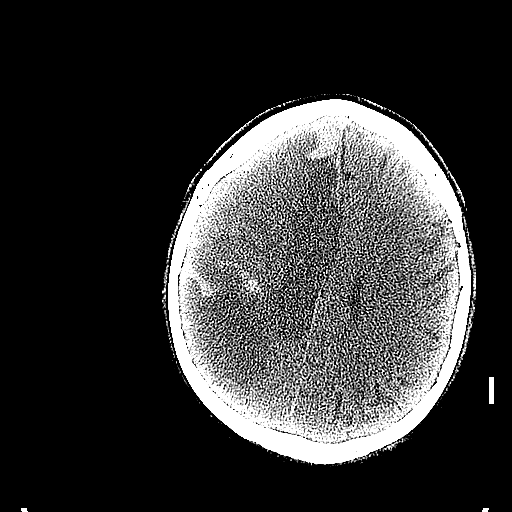
[im 42/72  bone]
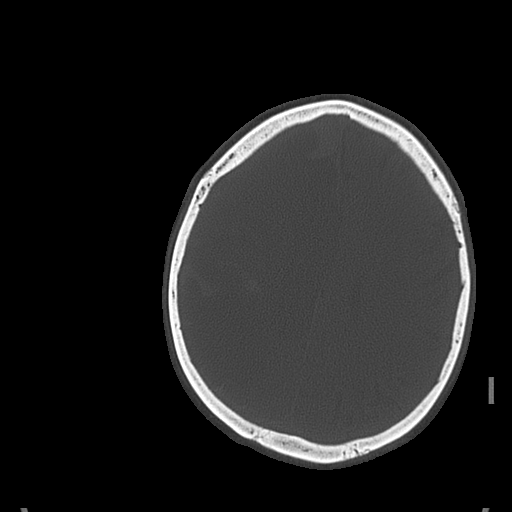
[im 45/72  brain]
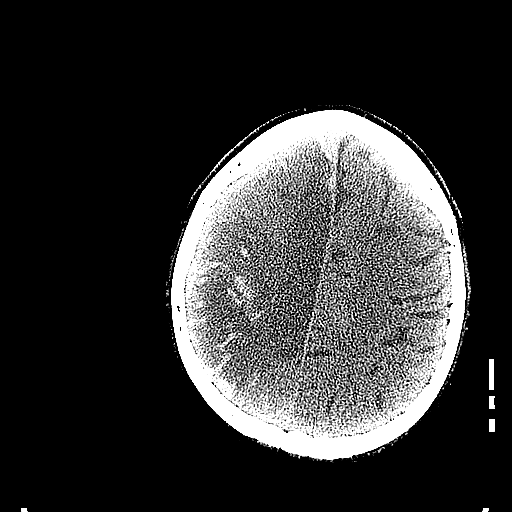
[im 49/72  brain]
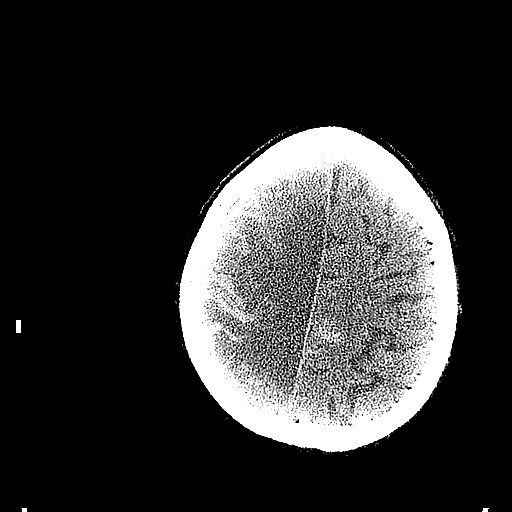
[im 53/72  brain]
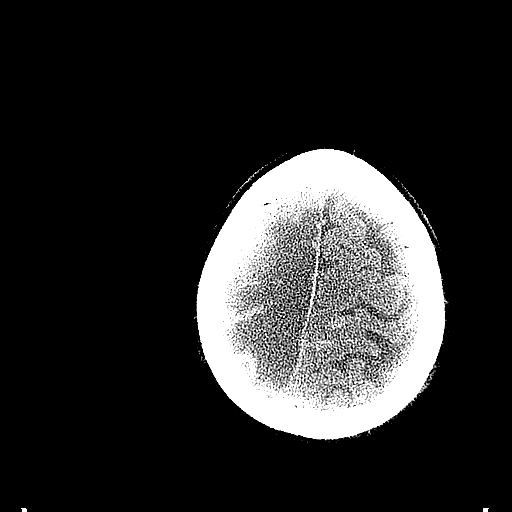
[im 60/72  brain]
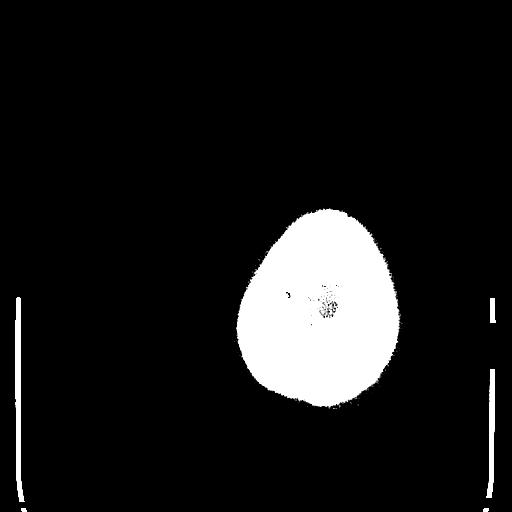
[im 60/72  bone]
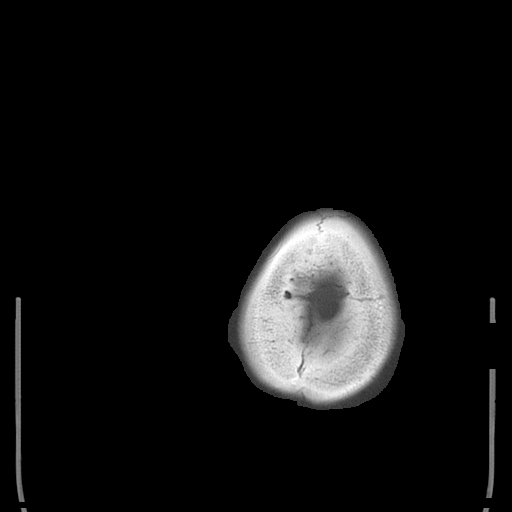
[im 64/72  brain]
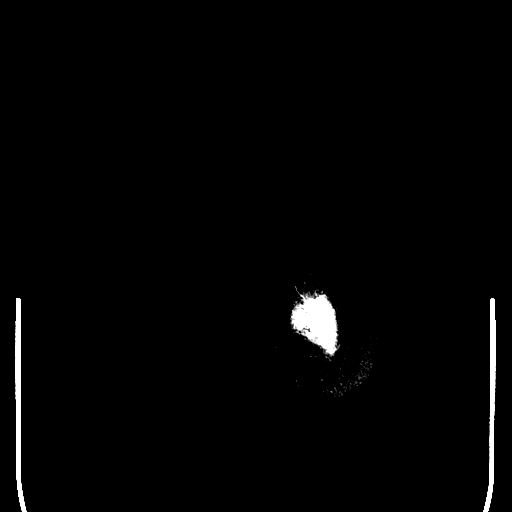
[im 68/72  brain]
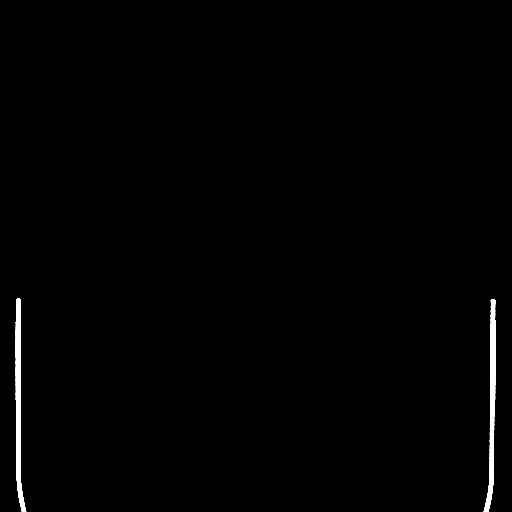

[15 of 30 positions shown; findings below may reference images not displayed]

FINDINGS: Interval progression of low attenuation changes which are
now seen throughout the right cerebral hemisphere consistent with
infarcts in the distribution of the anterior and middle cerebral
arteries as shown on previous MRI.  Since the previous CT study,
the area of low attenuation has increased significantly and there
is increased edema.  There is increased mass effect with effacement
of the right sided sulci and effacement the right lateral
ventricle.  There is midline shift from right to left of
approximately 8 mm.  There is interval development of multi focal
acute hemorrhage with hemorrhagic foci demonstrated in the areas of
infarct in the right anterior frontal, right posterior frontal,
right parietal, and temporal regions as well as in the left
convexity.  Layering hemorrhage is present in the left lateral
ventricle.  There is mild effacement of the right side of the basal
cisterns.
IMPRESSION: Significant interval progression of changes since previous study
with large infarction not demonstrated in the right cerebral
hemisphere.  Interval development of increased edema and mass
effect with 8 mm right-to-left midline shift.  Interval development
of intraparenchymal and subarachnoid hemorrhage in the right
cerebral hemisphere and focally in the left convexity.
Intraventricular hemorrhage in the left lateral ventricle.

Results were telephoned to the patient's nurse, Mark in the Neuro
of ICU at 3635 hours on 08/27/2012.

## 2014-09-15 DEATH — deceased
